# Patient Record
Sex: Female | Born: 1955 | Race: White | Hispanic: No | Marital: Married | Smoking: Former smoker
Health system: Southern US, Community
[De-identification: ages and names within clinical notes are randomized; demographics above are authoritative.]

## PROBLEM LIST (undated history)

## (undated) DIAGNOSIS — S2220XA Unspecified fracture of sternum, initial encounter for closed fracture: Secondary | ICD-10-CM

## (undated) DIAGNOSIS — S2249XA Multiple fractures of ribs, unspecified side, initial encounter for closed fracture: Secondary | ICD-10-CM

## (undated) DIAGNOSIS — T7840XA Allergy, unspecified, initial encounter: Secondary | ICD-10-CM

## (undated) DIAGNOSIS — I10 Essential (primary) hypertension: Secondary | ICD-10-CM

## (undated) DIAGNOSIS — M797 Fibromyalgia: Secondary | ICD-10-CM

## (undated) DIAGNOSIS — F909 Attention-deficit hyperactivity disorder, unspecified type: Secondary | ICD-10-CM

## (undated) DIAGNOSIS — F419 Anxiety disorder, unspecified: Secondary | ICD-10-CM

## (undated) DIAGNOSIS — E785 Hyperlipidemia, unspecified: Secondary | ICD-10-CM

## (undated) HISTORY — PX: ROTATOR CUFF REPAIR: SHX139

## (undated) HISTORY — DX: Multiple fractures of ribs, unspecified side, initial encounter for closed fracture: S22.49XA

## (undated) HISTORY — DX: Hyperlipidemia, unspecified: E78.5

## (undated) HISTORY — PX: BUNIONECTOMY: SHX129

## (undated) HISTORY — DX: Allergy, unspecified, initial encounter: T78.40XA

## (undated) HISTORY — DX: Attention-deficit hyperactivity disorder, unspecified type: F90.9

## (undated) HISTORY — DX: Essential (primary) hypertension: I10

## (undated) HISTORY — PX: OTHER SURGICAL HISTORY: SHX169

## (undated) HISTORY — DX: Anxiety disorder, unspecified: F41.9

## (undated) HISTORY — DX: Fibromyalgia: M79.7

## (undated) HISTORY — DX: Unspecified fracture of sternum, initial encounter for closed fracture: S22.20XA

## (undated) HISTORY — PX: TUBAL LIGATION: SHX77

---
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
## 1995-06-13 DIAGNOSIS — M797 Fibromyalgia: Secondary | ICD-10-CM

## 1995-06-13 HISTORY — DX: Fibromyalgia: M79.7

## 1998-06-28 ENCOUNTER — Other Ambulatory Visit: Admission: RE | Admit: 1998-06-28 | Discharge: 1998-06-28 | Payer: Self-pay | Admitting: Obstetrics & Gynecology

## 1999-06-29 ENCOUNTER — Other Ambulatory Visit: Admission: RE | Admit: 1999-06-29 | Discharge: 1999-06-29 | Payer: Self-pay | Admitting: Obstetrics and Gynecology

## 2000-11-01 ENCOUNTER — Other Ambulatory Visit: Admission: RE | Admit: 2000-11-01 | Discharge: 2000-11-01 | Payer: Self-pay | Admitting: Obstetrics and Gynecology

## 2000-12-21 ENCOUNTER — Ambulatory Visit (HOSPITAL_COMMUNITY): Admission: RE | Admit: 2000-12-21 | Discharge: 2000-12-21 | Payer: Self-pay | Admitting: Obstetrics and Gynecology

## 2000-12-21 ENCOUNTER — Encounter: Payer: Self-pay | Admitting: Obstetrics and Gynecology

## 2003-05-25 ENCOUNTER — Encounter: Admission: RE | Admit: 2003-05-25 | Discharge: 2003-05-25 | Payer: Self-pay | Admitting: Internal Medicine

## 2003-10-26 ENCOUNTER — Other Ambulatory Visit: Admission: RE | Admit: 2003-10-26 | Discharge: 2003-10-26 | Payer: Self-pay | Admitting: Obstetrics and Gynecology

## 2006-01-10 ENCOUNTER — Other Ambulatory Visit: Admission: RE | Admit: 2006-01-10 | Discharge: 2006-01-10 | Payer: Self-pay | Admitting: Obstetrics and Gynecology

## 2006-02-13 ENCOUNTER — Encounter: Admission: RE | Admit: 2006-02-13 | Discharge: 2006-02-13 | Payer: Self-pay | Admitting: Obstetrics and Gynecology

## 2006-05-10 ENCOUNTER — Ambulatory Visit (HOSPITAL_COMMUNITY): Admission: RE | Admit: 2006-05-10 | Discharge: 2006-05-10 | Payer: Self-pay | Admitting: Obstetrics and Gynecology

## 2006-05-10 ENCOUNTER — Encounter (INDEPENDENT_AMBULATORY_CARE_PROVIDER_SITE_OTHER): Payer: Self-pay | Admitting: Specialist

## 2008-06-26 ENCOUNTER — Ambulatory Visit (HOSPITAL_BASED_OUTPATIENT_CLINIC_OR_DEPARTMENT_OTHER): Admission: RE | Admit: 2008-06-26 | Discharge: 2008-06-26 | Payer: Self-pay | Admitting: Orthopedic Surgery

## 2009-05-04 ENCOUNTER — Encounter: Admission: RE | Admit: 2009-05-04 | Discharge: 2009-05-04 | Payer: Self-pay | Admitting: Obstetrics and Gynecology

## 2009-06-12 HISTORY — PX: HAND SURGERY: SHX662

## 2010-07-03 ENCOUNTER — Encounter: Payer: Self-pay | Admitting: Obstetrics and Gynecology

## 2010-09-26 LAB — BASIC METABOLIC PANEL
BUN: 13 mg/dL (ref 6–23)
Calcium: 9.9 mg/dL (ref 8.4–10.5)
Creatinine, Ser: 0.75 mg/dL (ref 0.4–1.2)
GFR calc Af Amer: >60 mL/min (ref >60)
GFR calc non Af Amer: >60 mL/min (ref >60)

## 2010-09-26 LAB — POCT HEMOGLOBIN-HEMACUE: Hemoglobin: 14.6 g/dL (ref 12.0–15.0)

## 2010-10-25 NOTE — Op Note (Signed)
NAMECHARLINE, Katrina Valdez           ACCOUNT NO.:  [id number]   MEDICAL RECORD NO.:  [id number]          PATIENT TYPE:  AMB   LOCATION:  DSC                          FACILITY:  [facility]   PHYSICIAN:  Katy Fitch. Sypher, M.D. DATE OF BIRTH:  [birth year]   DATE OF PROCEDURE:  DATE OF DISCHARGE:                               OPERATIVE REPORT   PREOPERATIVE DIAGNOSIS:  Painful myxoid cyst, right thumb, A1 pulley.   POSTOPERATIVE DIAGNOSIS:  Painful myxoid cyst, right thumb, A1 pulley.   OPERATIONS:  Resection of myxoid cyst and incidental release of A1  pulley.   OPERATING SURGEON:  Katy Fitch. Sypher, MD   ASSISTANT:  Marveen Reeks Dasnoit, PA   ANESTHESIA:  A 2% lidocaine, metacarpal head level block, and flexor  sheath block of right thumb supplemented by IV sedation.   SUPERVISING ANESTHESIOLOGIST:  Kaylyn Layer. Michelle Piper, MD   INDICATIONS:  Katrina Valdez is a 55 year old woman referred for  evaluation and management of a painful mass on the right thumb.  Clinical examination revealed signs of a cyst or myxoid mass on her  right thumb, A1 pulley proximal aspect.  This was troublesome to her.   She had no sign of stenosing tenosynovitis or finger, but had borderline  stenosing tenosynovitis of the thumb.   We recommended excision of local anesthesia and sedation.   After informed consent, she was brought to the operating room at this  time.   PROCEDURE:  Katrina Valdez was brought to the operating room and  placed in supine position on the operating table.   Following an anesthesia consult with Dr. Michelle Piper, general anesthesia by  LMA was rejected and MAC anesthesia recommended.   She was brought to room one at the [facility], placed in  supine position on the operating table and under Dr. Deirdre Priest supervision  sedation provided.  The right arm was then prepped with Betadine soap  solution, sterilely draped.  A pneumatic tourniquet was supplied in  proximal forearm.  Following  infiltration of 2% lidocaine, excellent  anesthesia was achieved in 3-4 minutes.  The right arm was then  exsanguinated with an Esmarch followed by inflation of arterial  tourniquet to 230 mmHg.  Procedure commenced with a short transverse  incision directly over the palpable mass.  Subcutaneous tissue was  carefully divided taking care to place blunt retractors to protect the  neurovascular structures.  The mass was on the proximal aspect of the A1  pulley.  This was circumferentially dissected with a rongeur.  Due to  the borderline stenosed tenosynovitis, the A1 pulley was released with  scalpel and scissors dissection.  Thereafter, free range of motion of  the IP joint of the thumb was recovered.   The wound was then repaired with mattress suture of 5-0 nylon x3.  There  were no apparent complications.  For aftercare, Ms. Stringfield is  provided prescription for Darvocet-N 100 one p.o. 4-6 hours p.r.n. pain,  20 tablets without refill.  We will see her back for followup in our  office for reevaluation in 1 week.      Katy Fitch Sypher, M.D.  Electronically Signed     RVS/MEDQ  D:  06/26/2008  T:  06/26/2008  Job:  [id number]

## 2010-10-28 NOTE — H&P (Signed)
NAMEDARBIE, BIANCARDI           ACCOUNT NO.:  [id number]   MEDICAL RECORD NO.:  [id number]          PATIENT TYPE:  AMB   LOCATION:  SDC                           FACILITY:  [facility]   PHYSICIAN:  Janine Limbo, M.D.DATE OF BIRTH:  1956/01/11   DATE OF ADMISSION:  DATE OF DISCHARGE:                              HISTORY & PHYSICAL   HISTORY OF PRESENT ILLNESS:  Ms. Dirusso is a 55 year old female, para  2-0-0-2, who presents for hysteroscopy with resection and D&C.  She will  also have a NovaSure ablation of the endometrium.  The patient has been  followed at the [facility] Gynecology division of  [facility].  The patient complains of menorrhagia.  An endometrial biopsy was performed that showed benign tissue.  An  ultrasound was performed, and she was found to have an endometrial mass  consistent with a polyp.  The patient had a Mirena IUD for 5 years, and  this was discontinued in August of 2007.  After the IUD was removed, the  patient began having extremely heavy bleeding to the point that she was  soiling her clothes and finding it difficult to work.  The patient's  hemoglobin is 16.8.  The patient is status post dilatation and  curettage.   ALLERGIES:  THE PATIENT REPORTS THAT SHE IS ALLERGIC TO PENICILLIN AND  CODEINE PRODUCTS.   PAST MEDICAL HISTORY:  1. History of hypertension.  2. History of attention deficit hyperactivity disorder.  3. History of fibromyalgia.  4. History of elevated cholesterol.  5. She reports that she has PMS.   PAST SURGICAL HISTORY:  1. She had her tonsils removed in 1973.  2. She had a bunionectomy in 1984.   MEDICATIONS:  She is currently being treated with hydrochlorothiazide,  Adderall, Lipitor, magnesium, black cohosh, 5-HTP, and flax seed oil.   PAST OBSTETRICAL HISTORY:  The patient has had 2 term vaginal  deliveries.   SOCIAL HISTORY:  The patient denies cigarette use.  She was a smoker  in  the past.  She denies recreational drug use.   FAMILY HISTORY:  The patient's father died from sepsis.   PHYSICAL EXAMINATION:  VITAL SIGNS:  Weight is 131 pounds, height 5 feet  3 inches.  HEENT:  Within normal limits.  CHEST:  Clear.  HEART:  Regular rate and rhythm.  BREASTS:  Without masses.  ABDOMEN:  Nontender.  EXTREMITIES:  Grossly normal.  NEUROLOGIC:  Grossly normal.  PELVIC:  External genitalia is normal.  The vagina is normal.  The  cervix is nontender.  The uterus is normal size, shape, and consistency.  Adnexa with no masses.   ASSESSMENT:  1. Menorrhagia.  2. Endometrial polyp.  3. Hypertension.  4. Attention deficit hyperactivity disorder.  5. Hyperlipidemia.   PLAN:  The patient will undergo a hysteroscopy with dilatation and  curettage.  She may have resection of an endometrial polyp.  She will  have NovaSure ablation of the endometrium.  The patient understands the  indications for her surgical procedure, and she accepts the risks of,  but not limited to, anesthetic complications,  bleeding, infection, and  possible damage to the surrounding organs.      Janine Limbo, M.D.  Electronically Signed     AVS/MEDQ  D:  05/08/2006  T:  05/08/2006  Job:  [id number]

## 2010-10-28 NOTE — Op Note (Signed)
NAMEKAYLANNI, EZELLE           ACCOUNT NO.:  [id number]   MEDICAL RECORD NO.:  [id number]          PATIENT TYPE:  AMB   LOCATION:  SDC                           [facility]ACILITY:  [facility]   PHYSICIAN:  Janine Limbo, M.D.DATE OF BIRTH:  09/30/1955   DATE OF PROCEDURE:  05/10/2006  DATE OF DISCHARGE:                               OPERATIVE REPORT   PREOPERATIVE DIAGNOSES:  1. Irregular menstrual cycles.  2. Endometrial polyps.   POSTOPERATIVE DIAGNOSES:  1. Irregular menstrual cycles.  2. Endometrial polyps.   PROCEDURES:  1. Hysteroscopy with dilatation and curettage.  2. NovaSure ablation.   SURGEON:  Dr. Leonard Schwartz.   FIRST ASSISTANT:  None.   ANESTHETIC:  General.   DISPOSITION:  Ms. Blankenburg is a 55 year old woman who had an IUD  removed in 01/2006.  She reports that, since that time, she has had  severe and heavy bleeding.  An endometrial biopsy was performed that  showed benign elements.  Hydrosonography showed an endometrial polyp.  The patient understands the indications for her surgical procedure and  she accepts the risks of, but not limited to, anesthetic complications,  bleeding, infection, and possible damage to the surrounding organs.   FINDINGS:  The uterus was normal size, shape and consistency.  No  adnexal masses were appreciated.  On hysteroscopy, the patient was found  to have endometrial polyps.  No other pathology was noted.   PROCEDURE:  The patient was taken to the operating room where a general  anesthetic was given.  The patient's abdomen, perineum, and vagina were  prepped with multiple layers of Betadine.  A Foley catheter was placed  in the bladder.  Examination under anesthesia was performed.  The  patient was then sterilely draped.  A paracervical block was placed  using 10 mL of 0.5% Marcaine with epinephrine.  An additional 10 mL of  0.5 Marcaine with epinephrine were injected directly into the cervix.  An endocervical  curettage was obtained.  The uterus was sounded to 8 cm.  The cervix was gently dilated.  The diagnostic hysteroscope was  inserted.  The cavity was carefully inspected.  Pictures were taken.  The patient was noted to have endometrial polyps.  The hysteroscope was  removed and the cavity was then curetted using a medium sharp curette.  The cavity was felt to be clean at the end of our procedure.  The cervix  was then dilated further.  The NovaSure instrument was then tested.  The  instrument was then placed in the uterus without difficulty.  The  sounding length was placed was 8 cm.  The cervical length was 3.5 cm.  That left a cavity length of 4.5 cm.  The cavity width was noted to be  2.5 cm.  A test procedure was performed and the integrity of the uterus  was confirmed.  The instrument was placed at a power of 62 and the  cavity was then ablated for total of 1 minute and 50 seconds.  The  patient tolerated her procedure well.  The NovaSure instrument was  removed.  The hysteroscope was inserted and again  the cavity was  inspected.  One additional picture was taken.  All instruments were then  removed.  Repeat examination showed the uterus to be firm and no other  pathology was noted.  Hemostasis was noted to be adequate.  The  estimated fluid deficit was 25 mL.  All instruments were removed.  The  patient was returned to the supine position.  She was awakened from her  anesthetic without difficulty and then transported to the recovery room  in stable condition.  Sponge, needle and instrument counts were correct.  The estimated blood loss for the procedure was 5 mL.   FOLLOW UP INSTRUCTIONS:  The patient will return to see Dr. Stefano Gaul in  2-3 weeks for follow-up examination.  She was given a prescription for  Motrin and she will take 600 mg every 6 hours.  She will take Darvocet-N  100 at 1 or 2 tablets every 4 hours as needed for severe pain.  She was  given a copy of the  postoperative instruction sheet as prepared by the  [facility] for patients who have undergone a  dilatation and curettage.  The endocervical curettage and endometrial  curettings were sent to pathology for evaluation.      Janine Limbo, M.D.  Electronically Signed     AVS/MEDQ  D:  05/10/2006  T:  05/10/2006  Job:  [id number]

## 2011-06-08 ENCOUNTER — Ambulatory Visit (INDEPENDENT_AMBULATORY_CARE_PROVIDER_SITE_OTHER): Payer: BC Managed Care – PPO | Admitting: Physician Assistant

## 2011-06-08 DIAGNOSIS — M79609 Pain in unspecified limb: Secondary | ICD-10-CM

## 2011-06-08 DIAGNOSIS — Z23 Encounter for immunization: Secondary | ICD-10-CM

## 2011-06-08 DIAGNOSIS — F988 Other specified behavioral and emotional disorders with onset usually occurring in childhood and adolescence: Secondary | ICD-10-CM

## 2011-06-15 ENCOUNTER — Ambulatory Visit: Payer: BC Managed Care – PPO | Admitting: Physician Assistant

## 2011-07-20 ENCOUNTER — Telehealth: Payer: Self-pay

## 2011-07-20 NOTE — Telephone Encounter (Signed)
PLEASE PULL CHART AND PUT AT [employer] DESK IN PHONE MESSAGES AND ROUTE BACK TO CLINICAL POOLE. THANKS!

## 2011-07-20 NOTE — Telephone Encounter (Signed)
.  [facility] PT IN NEED OF HER ADDERALL PLEASE CALL [phone]

## 2011-07-21 ENCOUNTER — Other Ambulatory Visit: Payer: Self-pay | Admitting: Internal Medicine

## 2011-07-21 MED ORDER — METHYLPHENIDATE HCL 20 MG PO TABS: ORAL_TABLET | ORAL | Status: DC

## 2011-07-21 NOTE — Telephone Encounter (Signed)
Chart pulled °

## 2011-07-21 NOTE — Telephone Encounter (Signed)
pts adderall last filled 06/08/11, f/up due 11/2011.

## 2011-07-21 NOTE — Telephone Encounter (Signed)
Ritalin RF done, F/U due 11/2010

## 2011-07-21 NOTE — Telephone Encounter (Signed)
Notified pt Rx ready for p/up.  Monico Blitz - please sign this Rx in Epic and close encounter

## 2011-07-24 ENCOUNTER — Other Ambulatory Visit: Payer: Self-pay | Admitting: Physician Assistant

## 2011-07-24 ENCOUNTER — Other Ambulatory Visit: Payer: Self-pay | Admitting: Internal Medicine

## 2011-07-24 MED ORDER — AMPHETAMINE-DEXTROAMPHETAMINE 20 MG PO TABS: 20.0000 mg | ORAL_TABLET | Freq: Three times a day (TID) | ORAL | Status: DC

## 2011-07-24 NOTE — Progress Notes (Signed)
Patient brought back Rx written by Eddie Candle for Ritalin. Patient is on Adderall not Ritalin. Unsure why Ritalin was Rx'd. No mention of change in therapy documented. Have discontinued Ritalin at this time and Rx'd Adderall.  Eula Listen, PA-C 07/24/2011 11:29 AM

## 2011-07-27 ENCOUNTER — Other Ambulatory Visit: Payer: Self-pay | Admitting: Internal Medicine

## 2011-07-27 MED ORDER — ALPRAZOLAM 0.5 MG PO TABS: ORAL_TABLET | ORAL | Status: DC

## 2011-07-31 ENCOUNTER — Other Ambulatory Visit: Payer: Self-pay | Admitting: Family Medicine

## 2011-08-08 ENCOUNTER — Telehealth: Payer: Self-pay

## 2011-08-08 MED ORDER — LISINOPRIL-HYDROCHLOROTHIAZIDE 10-12.5 MG PO TABS: 1.0000 | ORAL_TABLET | Freq: Every day | ORAL | Status: DC

## 2011-08-08 NOTE — Telephone Encounter (Signed)
rx sent into pharmacy and pt notified 

## 2011-08-08 NOTE — Telephone Encounter (Signed)
.  [facility]    PT REQUESTING REFILL ON BP MEDS   BEST PHONE [phone]   PHARMACY [facility] [address]

## 2011-09-01 ENCOUNTER — Telehealth: Payer: Self-pay

## 2011-09-01 NOTE — Telephone Encounter (Signed)
Chart pulled to PA 

## 2011-09-01 NOTE — Telephone Encounter (Signed)
PT IN NEED OF HER ADDERALL TO BE RE-WRITTEN PLEASE CALL [phone] WHEN READY

## 2011-09-02 MED ORDER — AMPHETAMINE-DEXTROAMPHETAMINE 20 MG PO TABS: 20.0000 mg | ORAL_TABLET | Freq: Three times a day (TID) | ORAL | Status: DC

## 2011-09-02 NOTE — Telephone Encounter (Signed)
ADVISED PT THAT RX IS READY FOR PICKUP 

## 2011-09-02 NOTE — Telephone Encounter (Signed)
Signed at TL desk.  

## 2011-09-06 ENCOUNTER — Ambulatory Visit (INDEPENDENT_AMBULATORY_CARE_PROVIDER_SITE_OTHER): Payer: BC Managed Care – PPO | Admitting: Family Medicine

## 2011-09-06 VITALS — BP 114/78 | HR 108 | Temp 98.5°F | Resp 18 | Ht 63.0 in | Wt 131.0 lb

## 2011-09-06 DIAGNOSIS — I10 Essential (primary) hypertension: Secondary | ICD-10-CM

## 2011-09-06 MED ORDER — LISINOPRIL-HYDROCHLOROTHIAZIDE 10-12.5 MG PO TABS: 1.0000 | ORAL_TABLET | Freq: Every day | ORAL | Status: DC

## 2011-09-06 NOTE — Progress Notes (Signed)
  Patient Name: Katrina Valdez Date of Birth: 09-Jul-1955 Medical Record Number: [id number] Gender: female Date of Encounter: 09/06/2011  History of Present Illness:  Katrina Valdez is a 56 y.o. very pleasant female patient who presents with the following:  Here today to recheck her BP.  She feels that she is doing well.  She does not wish to have labs drawn today but does agree to have fasting labs done this summer.    There is no problem list on file for this patient.  No past medical history on file. No past surgical history on file. History  Substance Use Topics  . Smoking status: Former Games developer  . Smokeless tobacco: Not on file  . Alcohol Use: Not on file   No family history on file. Allergies  Allergen Reactions  . Codeine   . Penicillins   . Sulfa Antibiotics     Medication list has been reviewed and updated.  Review of Systems: As per HPI- otherwise negative.   Physical Examination: Filed Vitals:   09/06/11 0900  BP: 114/78  Pulse: 108  Temp: 98.5 F (36.9 C)  TempSrc: Oral  Resp: 18  Height: 5\' 3"  (1.6 m)  Weight: 131 lb (59.421 kg)  recheck pulse- 100BPM  Body mass index is 23.21 kg/(m^2).  GEN: WDWN, NAD, Non-toxic, A & O x 3, slim build HEENT: Atraumatic, Normocephalic. Neck supple. No masses, No LAD. Ears and Nose: No external deformity. CV: RRR, No M/G/R. No JVD. No thrill. No extra heart sounds. PULM: CTA B, no wheezes, crackles, rhonchi. No retractions. No resp. distress. No accessory muscle use. EXTR: No c/c/e NEURO Normal gait.  PSYCH: Normally interactive. Conversant. Not depressed or anxious appearing.  Calm demeanor.    Assessment and Plan: 1. HTN (hypertension)  lisinopril-hydrochlorothiazide (PRINZIDE,ZESTORETIC) 10-12.5 MG per tablet   Refilled medication- BP well controlled today.  She will follow- up for fasting labs in a few months.

## 2011-10-04 ENCOUNTER — Telehealth: Payer: Self-pay

## 2011-10-04 MED ORDER — AMPHETAMINE-DEXTROAMPHETAMINE 20 MG PO TABS: 20.0000 mg | ORAL_TABLET | Freq: Three times a day (TID) | ORAL | Status: DC

## 2011-10-04 MED ORDER — ALPRAZOLAM 0.5 MG PO TABS: ORAL_TABLET | ORAL | Status: DC

## 2011-10-04 NOTE — Telephone Encounter (Signed)
To Chelle for review

## 2011-10-04 NOTE — Telephone Encounter (Signed)
Pt would like for Chelle to write rx for adderall also would like her to see if she can go ahead and do rx for xanax, I Alcario Drought ) explained that she would have to call it into the pharmacy and pt says that she feels that both could be written at same time because it takes so long to get rx filled and plus her husband its about have surgery and doesn't want to have to worry about her not having her xanax right now.

## 2011-10-04 NOTE — Telephone Encounter (Signed)
lmom that rx's are ready for pickup 

## 2011-10-04 NOTE — Telephone Encounter (Signed)
Can we write these rx for this patient?

## 2011-10-04 NOTE — Telephone Encounter (Signed)
Rx's printed. Please call patient.

## 2011-11-08 ENCOUNTER — Telehealth: Payer: Self-pay

## 2011-11-08 MED ORDER — AMPHETAMINE-DEXTROAMPHETAMINE 20 MG PO TABS: 20.0000 mg | ORAL_TABLET | Freq: Three times a day (TID) | ORAL | Status: DC

## 2011-11-08 NOTE — Telephone Encounter (Signed)
PT REQUESTING ADDERALL REFILL  BEST PHONE [phone]

## 2011-11-08 NOTE — Telephone Encounter (Signed)
Rx printed.  Needs F/U and fasting labs in July.

## 2011-11-08 NOTE — Telephone Encounter (Signed)
LMOM that rx was ready for pick up. 

## 2011-11-10 ENCOUNTER — Telehealth: Payer: Self-pay

## 2011-11-10 NOTE — Telephone Encounter (Signed)
Pt was actually calling in regards to Adderall refills. She was told that she needed an office visit before anymore fills.  I advised her that Chelle did not put anything in regards to having an ov and to just call in for a refill as she usually does

## 2011-11-10 NOTE — Telephone Encounter (Signed)
The patient has questions regarding an appointment to discuss her BP medication.  The patient states that she was seen last week for this and her Rx should be good for 6 months to a year.  The patient states she was called and told she has to be seen prior to her BP medication being renewed.  Please call patient at [phone].

## 2011-12-07 ENCOUNTER — Telehealth: Payer: Self-pay

## 2011-12-07 NOTE — Telephone Encounter (Signed)
PATIENT WAS SEEN FOR ALLERGIES.  NOW IT IS GOING TO HER SINUSES, EYES AND BOTHERING HER THROAT.  HER DAUGHTER HAS A NEW BORN AND SHE IS ASSISTING HER DAUGHTER AFTER SHE HAD THE BABY C-SECTION.  DAUGHTER IS ON BED REST. CANT COME IN.  PLEASE CALL.  USING [facility]

## 2011-12-07 NOTE — Telephone Encounter (Signed)
She was last seen in March for her HTN. She needs to come in for an office visit

## 2011-12-08 ENCOUNTER — Ambulatory Visit (INDEPENDENT_AMBULATORY_CARE_PROVIDER_SITE_OTHER): Payer: BC Managed Care – PPO | Admitting: Emergency Medicine

## 2011-12-08 VITALS — BP 114/75 | HR 86 | Temp 98.0°F | Resp 16 | Ht 63.0 in | Wt 125.0 lb

## 2011-12-08 DIAGNOSIS — F988 Other specified behavioral and emotional disorders with onset usually occurring in childhood and adolescence: Secondary | ICD-10-CM

## 2011-12-08 DIAGNOSIS — H1030 Unspecified acute conjunctivitis, unspecified eye: Secondary | ICD-10-CM

## 2011-12-08 DIAGNOSIS — J018 Other acute sinusitis: Secondary | ICD-10-CM

## 2011-12-08 MED ORDER — AMPHETAMINE-DEXTROAMPHETAMINE 20 MG PO TABS: 20.0000 mg | ORAL_TABLET | Freq: Three times a day (TID) | ORAL | Status: DC

## 2011-12-08 MED ORDER — LEVOFLOXACIN 500 MG PO TABS: 500.0000 mg | ORAL_TABLET | Freq: Every day | ORAL | Status: AC

## 2011-12-08 MED ORDER — PSEUDOEPHEDRINE-GUAIFENESIN ER 60-600 MG PO TB12: 1.0000 | ORAL_TABLET | Freq: Two times a day (BID) | ORAL | Status: AC

## 2011-12-08 MED ORDER — CIPROFLOXACIN HCL 0.3 % OP SOLN: 1.0000 [drp] | OPHTHALMIC | Status: AC

## 2011-12-08 NOTE — Telephone Encounter (Signed)
Pt came to clinic for OV 12/09/11.

## 2011-12-08 NOTE — Progress Notes (Signed)
Patient Name: Katrina Valdez Date of Birth: 12/30/1955 Medical Record Number: [id number] Gender: female Date of Encounter: 12/08/2011  History of Present Illness:  Katrina Valdez is a 56 y.o. very pleasant female patient who presents with the following:  History of allergic rhinitis and frequent sinusitis.  Says that she ha been experiencing increasing nasal congestion, sinus pressure, post nasal discharge and green nasal discharge.  Has sore throat and non productive cough.  No fever or chills.  No improvement with otc.  Has bilateral red eyes related to contacts.  Needs refill of adderall  There is no problem list on file for this patient.  No past medical history on file. No past surgical history on file. History  Substance Use Topics  . Smoking status: Former Games developer  . Smokeless tobacco: Not on file  . Alcohol Use: Not on file   No family history on file. Allergies  Allergen Reactions  . Codeine   . Penicillins   . Sulfa Antibiotics     Medication list has been reviewed and updated.  Prior to Admission medications   Medication Sig Start Date End Date Taking? Authorizing Provider  ALPRAZolam Prudy Feeler) 0.5 MG tablet Take one half to one twice daily as needed 10/04/11  Yes Chelle S Nikira Kushnir, PA-C  amphetamine-dextroamphetamine (ADDERALL) 20 MG tablet Take 1 tablet (20 mg total) by mouth 3 (three) times daily. Do not fill before 02/07/2012 12/08/11 01/07/12 Yes Phillips Odor, MD  amphetamine-dextroamphetamine (ADDERALL) 20 MG tablet Take 1 tablet (20 mg total) by mouth 3 (three) times daily. Do not refill prior to 01/07/2012 12/08/11 01/07/12 Yes Phillips Odor, MD  lisinopril-hydrochlorothiazide (PRINZIDE,ZESTORETIC) 10-12.5 MG per tablet Take 1 tablet by mouth daily. 09/06/11 09/05/12 Yes Jessica C Copland, MD  amphetamine-dextroamphetamine (ADDERALL, 20MG ,) 20 MG tablet Take 1 tablet (20 mg total) by mouth 3 (three) times daily. 10/04/11 11/03/11  Chelle S Hazely Sealey, PA-C    ciprofloxacin (CILOXAN) 0.3 % ophthalmic solution Place 1 drop into both eyes every 2 (two) hours. Administer 1 drop, every 2 hours, while awake, for 2 days. Then 1 drop, every 4 hours, while awake, for the next 5 days. 12/08/11 12/18/11  Phillips Odor, MD  levofloxacin (LEVAQUIN) 500 MG tablet Take 1 tablet (500 mg total) by mouth daily. 12/08/11 12/18/11  Phillips Odor, MD  pseudoephedrine-guaifenesin (MUCINEX D) 60-600 MG per tablet Take 1 tablet by mouth every 12 (twelve) hours. 12/08/11 12/07/12  Phillips Odor, MD    Review of Systems:  As per HPI, otherwise negative.    Physical Examination: Filed Vitals:   12/08/11 0921  BP: 114/75  Pulse: 86  Temp: 98 F (36.7 C)  Resp: 16   Filed Vitals:   12/08/11 0921  Height: 5\' 3"  (1.6 m)  Weight: 125 lb (56.7 kg)   Body mass index is 22.14 kg/(m^2). Ideal Body Weight: Weight in (lb) to have BMI = 25: 140.8   GEN: WDWN, NAD, Non-toxic, A & O x 3 HEENT: Atraumatic, Normocephalic. Neck supple. No masses, No LAD.  Bilateral conjunctival injection no chemosis or fb. Ears and Nose: No external deformity. Green nasal and post nasal drainage and tenderness over maxillary sinuses CV: RRR, No M/G/R. No JVD. No thrill. No extra heart sounds. PULM: CTA B, no wheezes, crackles, rhonchi. No retractions. No resp. distress. No accessory muscle use. ABD: S, NT, ND, +BS. No rebound. No HSM. EXTR: No c/c/e NEURO Normal gait.  PSYCH: Normally interactive. Conversant. Not depressed or anxious appearing.  Calm demeanor.  Assessment and Plan: Sinusitis Conjunctivitis ADD Meds and follow up as needed and in 3 months  Carmelina Dane, MD

## 2011-12-28 ENCOUNTER — Other Ambulatory Visit: Payer: Self-pay | Admitting: Family Medicine

## 2011-12-28 ENCOUNTER — Telehealth: Payer: Self-pay

## 2011-12-28 MED ORDER — ALPRAZOLAM 0.5 MG PO TABS: ORAL_TABLET | ORAL | Status: DC

## 2011-12-28 NOTE — Telephone Encounter (Signed)
[facility] notifying patient rx faxed in tonight.

## 2011-12-28 NOTE — Telephone Encounter (Signed)
Message for Chelle-request is being sent for xanax to be filled..[phone]

## 2012-02-28 ENCOUNTER — Other Ambulatory Visit: Payer: Self-pay

## 2012-02-28 MED ORDER — ALPRAZOLAM 0.5 MG PO TABS: ORAL_TABLET | ORAL | Status: DC

## 2012-03-27 ENCOUNTER — Telehealth: Payer: Self-pay

## 2012-03-27 MED ORDER — AMPHETAMINE-DEXTROAMPHETAMINE 20 MG PO TABS: 20.0000 mg | ORAL_TABLET | Freq: Three times a day (TID) | ORAL | Status: DC

## 2012-03-27 NOTE — Telephone Encounter (Signed)
The patient called to request refill of Adderall.  Please call patient at [phone] when ready for pick up.

## 2012-03-27 NOTE — Telephone Encounter (Signed)
Prescription printed and signed.  Per last note (12/08/11) pt to follow-up in 3 months.  Will need OV for more refills.

## 2012-03-27 NOTE — Telephone Encounter (Signed)
I have pended the Rx. Please advise.

## 2012-03-28 NOTE — Telephone Encounter (Signed)
Called her, patient advised this is ready for pick up

## 2012-03-29 ENCOUNTER — Telehealth: Payer: Self-pay

## 2012-03-29 NOTE — Telephone Encounter (Signed)
Received notice that pt's Adderall Prior Katrina Valdez is getting ready to expire. Called [employer] and received approval to continue prior auth. Notified pt that it was approved again.

## 2012-04-28 ENCOUNTER — Ambulatory Visit (INDEPENDENT_AMBULATORY_CARE_PROVIDER_SITE_OTHER): Payer: BC Managed Care – PPO | Admitting: Family Medicine

## 2012-04-28 VITALS — BP 121/80 | HR 84 | Temp 97.6°F | Resp 16 | Ht 63.0 in | Wt 126.0 lb

## 2012-04-28 DIAGNOSIS — F419 Anxiety disorder, unspecified: Secondary | ICD-10-CM | POA: Insufficient documentation

## 2012-04-28 DIAGNOSIS — F411 Generalized anxiety disorder: Secondary | ICD-10-CM

## 2012-04-28 DIAGNOSIS — F909 Attention-deficit hyperactivity disorder, unspecified type: Secondary | ICD-10-CM | POA: Insufficient documentation

## 2012-04-28 DIAGNOSIS — I10 Essential (primary) hypertension: Secondary | ICD-10-CM

## 2012-04-28 DIAGNOSIS — Z23 Encounter for immunization: Secondary | ICD-10-CM

## 2012-04-28 LAB — COMPREHENSIVE METABOLIC PANEL
ALT: 15 U/L (ref 0–35)
AST: 19 U/L (ref 0–37)
Albumin: 4.7 g/dL (ref 3.5–5.2)
BUN: 14 mg/dL (ref 6–23)
CO2: 29 mEq/L (ref 19–32)
Calcium: 9.7 mg/dL (ref 8.4–10.5)
Chloride: 101 mEq/L (ref 96–112)
Creat: 0.54 mg/dL (ref 0.50–1.10)
Potassium: 4.3 mEq/L (ref 3.5–5.3)

## 2012-04-28 LAB — POCT CBC
Granulocyte percent: 45.2 %G (ref 37–80)
Hemoglobin: 14.9 g/dL (ref 12.2–16.2)
MCH, POC: 28.3 pg (ref 27–31.2)
MCV: 92.6 fL (ref 80–97)
RBC: 5.27 M/uL (ref 4.04–5.48)
WBC: 6.2 10*3/uL (ref 4.6–10.2)

## 2012-04-28 LAB — LIPID PANEL
HDL: 81 mg/dL (ref >39)
LDL Cholesterol: 126 mg/dL — ABNORMAL HIGH (ref 0–99)
Triglycerides: 89 mg/dL (ref <150)

## 2012-04-28 LAB — TSH: TSH: 0.788 u[IU]/mL (ref 0.350–4.500)

## 2012-04-28 LAB — POCT URINALYSIS DIPSTICK
Bilirubin, UA: NEGATIVE
Glucose, UA: NEGATIVE
Ketones, UA: NEGATIVE
Nitrite, UA: NEGATIVE
pH, UA: 7

## 2012-04-28 MED ORDER — LISINOPRIL-HYDROCHLOROTHIAZIDE 10-12.5 MG PO TABS: 1.0000 | ORAL_TABLET | Freq: Every day | ORAL | Status: DC

## 2012-04-28 MED ORDER — AMPHETAMINE-DEXTROAMPHETAMINE 20 MG PO TABS: 20.0000 mg | ORAL_TABLET | Freq: Three times a day (TID) | ORAL | Status: DC

## 2012-04-28 MED ORDER — ALPRAZOLAM 0.5 MG PO TABS: ORAL_TABLET | ORAL | Status: DC

## 2012-04-28 NOTE — Patient Instructions (Addendum)
1. Essential hypertension, benign  POCT CBC, Comprehensive metabolic panel, TSH, Lipid panel, POCT urinalysis dipstick, EKG 12-Lead  2. Need for prophylactic vaccination and inoculation against influenza  Flu vaccine greater than or equal to 58yo preservative free IM  3. ADHD (attention deficit hyperactivity disorder)    4. Anxiety    5. HTN (hypertension)  lisinopril-hydrochlorothiazide (PRINZIDE,ZESTORETIC) 10-12.5 MG per tablet     RETURN TO OFFICE IN SIX MONTHS FOR FOLLOW-UP ON ADHD, HYPERTENSION, ANXIETY.

## 2012-04-28 NOTE — Progress Notes (Signed)
[address]   [place], [place]  [address]   [phone]  Subjective:    Patient ID: Katrina Valdez, female    DOB: 1955-08-18, 56 y.o.   MRN: [id number]  HPIThis 56 y.o. female presents for evaluation of the following:  1.  ADD:  Onset age 18; has been on multiple medications in past.  Has worked in [employer].  No side effects; no decreased appetite, headache, insomnia.  Both children ADHD.   Current dose is working well for patient; takes as needed.  Hyperactive.    2.  HTN:  No chest pain, shortness of breath, palpitations, leg swelling.  Not checking BP at home.  Maintained on medication for ten years.  [place] four days per week.  Denies CP/palp/SOB/leg swelling. Denies HA/dizziness/focal weakness/paresthesias.  3. Anxiety:  Takes Xanax as needed; recently husband had B knee replacement; grandchild born.  Taking Xanax as needed.  No daily use.  Usually takes twice per week.  20 months and 5 months.    4.  Immunizations:  Agreeable to flu vaccine; TDAP 2008.    PCP:  Theora Gianotti   Review of Systems  Constitutional: Negative for fever, chills, diaphoresis and fatigue.  Respiratory: Negative for cough, shortness of breath and wheezing.   Cardiovascular: Negative for chest pain, palpitations and leg swelling.  Neurological: Negative for dizziness, tremors, seizures, syncope, facial asymmetry, speech difficulty, weakness, light-headedness, numbness and headaches.  Psychiatric/Behavioral: Positive for decreased concentration. Negative for dysphoric mood and agitation. The patient is nervous/anxious.     Past Medical History  Diagnosis Date  . Hypertension   . Fibromyalgia 1997  . ADHD (attention deficit hyperactivity disorder)   . Allergy   . Anxiety     Past Surgical History  Procedure Date  . Tubal ligation   . Hand surgery 2011  . Tonsills remov 1973  . Sepsis   . Bunionectomy   . Uterine ablation     Prior to Admission medications   Medication  Sig Start Date End Date Taking? Authorizing Provider  ALPRAZolam Prudy Feeler) 0.5 MG tablet Take one half to one twice daily as needed 02/28/12  Yes Ryan M Dunn, PA-C  amphetamine-dextroamphetamine (ADDERALL) 20 MG tablet Take 20 mg by mouth 3 (three) times daily.   Yes Historical Provider, MD  lisinopril-hydrochlorothiazide (PRINZIDE,ZESTORETIC) 10-12.5 MG per tablet Take 1 tablet by mouth daily. 09/06/11 09/05/12 Yes Jessica C Copland, MD  amphetamine-dextroamphetamine (ADDERALL) 20 MG tablet Take 1 tablet (20 mg total) by mouth 3 (three) times daily. Do not fill before 02/07/2012 12/08/11 01/07/12  Phillips Odor, MD  amphetamine-dextroamphetamine (ADDERALL) 20 MG tablet Take 1 tablet (20 mg total) by mouth 3 (three) times daily. Do not refill prior to 01/07/2012 12/08/11 01/07/12  Phillips Odor, MD  amphetamine-dextroamphetamine (ADDERALL) 20 MG tablet Take 1 tablet (20 mg total) by mouth 3 (three) times daily. 03/27/12 04/26/12  Eleanore Delia Chimes, PA-C  pseudoephedrine-guaifenesin (MUCINEX D) 60-600 MG per tablet Take 1 tablet by mouth every 12 (twelve) hours. 12/08/11 12/07/12  Phillips Odor, MD    Allergies  Allergen Reactions  . Codeine   . Penicillins   . Sulfa Antibiotics     History   Social History  . Marital Status: Married    Spouse Name: N/A    Number of Children: N/A  . Years of Education: N/A   Occupational History  . Not on file.   Social History Main Topics  . Smoking status: Former Games developer  . Smokeless tobacco:  Never Used  . Alcohol Use: Not on file  . Drug Use: Not on file  . Sexually Active: Yes   Other Topics Concern  . Not on file   Social History Narrative   Marital: married x 33 years; happily married.   Children:  2 children; 2 grandchildren.   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   Exercise:  [place] 4 days per week    Family History  Problem Relation Age of  Onset  . Stroke Mother 65    hemorrhagic CVA x 2  . Hypertension Mother   . Heart disease Mother 20    AMI  . Hyperlipidemia Son   . Cancer Paternal Aunt        Objective:   Physical Exam  Nursing note and vitals reviewed. Constitutional: She is oriented to person, place, and time. She appears well-developed and well-nourished. No distress.  HENT:  Head: Normocephalic and atraumatic.  Eyes: Conjunctivae normal are normal. Pupils are equal, round, and reactive to light.  Neck: Normal range of motion. Neck supple. No thyromegaly present.  Cardiovascular: Normal rate, regular rhythm, normal heart sounds and intact distal pulses.  Exam reveals no gallop and no friction rub.   No murmur heard. Pulmonary/Chest: Effort normal and breath sounds normal. No respiratory distress. She has no wheezes. She has no rales.  Lymphadenopathy:    She has no cervical adenopathy.  Neurological: She is alert and oriented to person, place, and time. No cranial nerve deficit. She exhibits normal muscle tone. Coordination normal.  Skin: She is not diaphoretic.  Psychiatric: She has a normal mood and affect. Her behavior is normal. Judgment and thought content normal.   INFLUENZA VACCINE ADMINISTERED IN OFFICE.  EKG:  NSR.  Results for orders placed in visit on 04/28/12  POCT CBC      Component Value Range   WBC 6.2  4.6 - 10.2 K/uL   Lymph, poc 2.9  0.6 - 3.4   POC LYMPH PERCENT 47.3  10 - 50 %L   MID (cbc) 0.5  0 - 0.9   POC MID % 7.5  0 - 12 %M   POC Granulocyte 2.8  2 - 6.9   Granulocyte percent 45.2  37 - 80 %G   RBC 5.27  4.04 - 5.48 M/uL   Hemoglobin 14.9  12.2 - 16.2 g/dL   HCT, POC 40.9 (*) 81.1 - 47.9 %   MCV 92.6  80 - 97 fL   MCH, POC 28.3  27 - 31.2 pg   MCHC 30.5 (*) 31.8 - 35.4 g/dL   RDW, POC 91.4     Platelet Count, POC 360  142 - 424 K/uL   MPV 7.7  0 - 99.8 fL  POCT URINALYSIS DIPSTICK      Component Value Range   Color, UA yellow     Clarity, UA clear     Glucose, UA  neg     Bilirubin, UA neg     Ketones, UA neg     Spec Grav, UA 1.015     Blood, UA neg     pH, UA 7.0     Protein, UA neg     Urobilinogen, UA 0.2     Nitrite, UA neg     Leukocytes, UA moderate (2+)        Assessment & Plan:   1. Essential hypertension, benign  POCT CBC, Comprehensive metabolic panel, TSH, Lipid panel, POCT urinalysis dipstick, EKG 12-Lead  2. Need for prophylactic vaccination and inoculation against influenza  Flu vaccine greater than or equal to 3yo preservative free IM  3. ADHD (attention deficit hyperactivity disorder)    4. Anxiety

## 2012-04-29 ENCOUNTER — Encounter: Payer: Self-pay | Admitting: Family Medicine

## 2012-04-29 NOTE — Assessment & Plan Note (Signed)
Stable; intermittent; using Xanax twice weekly; refill provided.  Follow-up every six months; pt expressed understanding.

## 2012-04-29 NOTE — Assessment & Plan Note (Signed)
Administered in office; TDAP UTD in 2008.

## 2012-04-29 NOTE — Assessment & Plan Note (Signed)
Controlled.  Refills x 3 months provided; follow-up every six months; pt expressed understanding.

## 2012-04-29 NOTE — Assessment & Plan Note (Signed)
Controlled; obtain labs; s/p u/a and EKG in office.  Follow-up every six months; pt expressed understanding.

## 2012-06-12 HISTORY — PX: STERNUM: HXRAD1619

## 2012-06-14 ENCOUNTER — Telehealth: Payer: Self-pay | Admitting: *Deleted

## 2012-06-14 NOTE — Telephone Encounter (Signed)
[facility] requesting refill on alprazolam 0.5mg .  Last refill 05/04/12

## 2012-06-17 MED ORDER — ALPRAZOLAM 0.5 MG PO TABS: ORAL_TABLET | ORAL | Status: DC

## 2012-06-17 NOTE — Telephone Encounter (Signed)
OK to call in RF for Alprazolam 0.5mg  one po bid #60 no refills.  Hard copy printed for calling into [facility]. KMS

## 2012-06-17 NOTE — Telephone Encounter (Signed)
rx called in

## 2012-08-20 ENCOUNTER — Telehealth: Payer: Self-pay

## 2012-08-20 NOTE — Telephone Encounter (Signed)
Katrina Valdez,  PT. NEEDS A REFILL OF HER ADDERALL AND XANAX. PLEASE CALL. [phone]

## 2012-08-21 ENCOUNTER — Ambulatory Visit (INDEPENDENT_AMBULATORY_CARE_PROVIDER_SITE_OTHER): Payer: BC Managed Care – PPO | Admitting: Physician Assistant

## 2012-08-21 VITALS — BP 116/76 | HR 95 | Temp 97.7°F | Resp 16 | Ht 63.0 in | Wt 127.8 lb

## 2012-08-21 DIAGNOSIS — F411 Generalized anxiety disorder: Secondary | ICD-10-CM

## 2012-08-21 DIAGNOSIS — F988 Other specified behavioral and emotional disorders with onset usually occurring in childhood and adolescence: Secondary | ICD-10-CM

## 2012-08-21 DIAGNOSIS — J309 Allergic rhinitis, unspecified: Secondary | ICD-10-CM

## 2012-08-21 DIAGNOSIS — I10 Essential (primary) hypertension: Secondary | ICD-10-CM

## 2012-08-21 DIAGNOSIS — F419 Anxiety disorder, unspecified: Secondary | ICD-10-CM

## 2012-08-21 MED ORDER — AMPHETAMINE-DEXTROAMPHETAMINE 20 MG PO TABS: 20.0000 mg | ORAL_TABLET | Freq: Three times a day (TID) | ORAL | Status: DC

## 2012-08-21 MED ORDER — IPRATROPIUM BROMIDE 0.03 % NA SOLN: 2.0000 | Freq: Two times a day (BID) | NASAL | Status: DC

## 2012-08-21 MED ORDER — ALPRAZOLAM 0.5 MG PO TABS: ORAL_TABLET | ORAL | Status: DC

## 2012-08-21 MED ORDER — LISINOPRIL-HYDROCHLOROTHIAZIDE 10-12.5 MG PO TABS: 1.0000 | ORAL_TABLET | Freq: Every day | ORAL | Status: DC

## 2012-08-21 MED ORDER — CETIRIZINE HCL 10 MG PO TABS: 10.0000 mg | ORAL_TABLET | Freq: Every day | ORAL | Status: DC

## 2012-08-21 MED ORDER — FLUTICASONE PROPIONATE 50 MCG/ACT NA SUSP: 2.0000 | Freq: Every day | NASAL | Status: DC

## 2012-08-21 NOTE — Progress Notes (Signed)
Subjective:    Patient ID: Katrina Valdez, female    DOB: [birth year], 57 y.o.   MRN: [id number]  HPI   Katrina Valdez is a pleasant 57 yr old female here with concern about allergies.  States she has had allergy symptoms since about Thanksgiving, worse in the last 2 months.  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  Feels like she is getting exposed to "daycare germs".  Experiencing nasal/sinus congestion, ear fullness, headache, itchy/watery eyes, some cough.  She does not take anything for allergies currently.  Has tried Claritin without relief.  Concerned about which medications she can take with her hypertension.  Feels like she got good relief with a medication prescribed to her last June (on chart review, Mucinex-D).  States she has used a nasal spray in the past, not sure which one but thinks maybe Flonase.  Has been doing work out in the yard, clearing trees.  Feels like this may be contributing to symptoms.  Concerned that she will progress to sinus infection or ear infection as this has happened in the past.     Review of Systems  Constitutional: Negative for fever and chills.  HENT: Positive for ear pain, congestion, rhinorrhea and postnasal drip.   Eyes: Positive for discharge (watery) and itching.  Respiratory: Positive for cough. Negative for shortness of breath and wheezing.   Cardiovascular: Negative.   Gastrointestinal: Negative.   Musculoskeletal: Negative.   Neurological: Positive for headaches.       Objective:   Physical Exam  Vitals reviewed. Constitutional: She is oriented to person, place, and time. She appears well-developed and well-nourished. No distress.  HENT:  Head: Normocephalic and atraumatic.  Right Ear: Tympanic membrane and ear canal normal.  Left Ear: Tympanic membrane and ear canal normal.  Nose: Nose normal. Right sinus exhibits no maxillary sinus tenderness and no frontal sinus tenderness. Left sinus exhibits no  maxillary sinus tenderness and no frontal sinus tenderness.  Mouth/Throat: Uvula is midline, oropharynx is clear and moist and mucous membranes are normal.  Eyes: Conjunctivae are normal. No scleral icterus.  Neck: Neck supple.  Cardiovascular: Normal rate, regular rhythm and normal heart sounds.  Exam reveals no gallop and no friction rub.   No murmur heard. Pulmonary/Chest: Effort normal and breath sounds normal. She has no wheezes. She has no rales.  Lymphadenopathy:    She has no cervical adenopathy.  Neurological: She is alert and oriented to person, place, and time.  Skin: Skin is warm and dry.  Psychiatric: She has a normal mood and affect. Her behavior is normal.      Filed Vitals:   08/21/12 0855  BP: 116/76  Pulse: 95  Temp: 97.7 F (36.5 C)  Resp: 16       Assessment & Plan:  Allergic rhinitis - Plan: cetirizine (ZYRTEC) 10 MG tablet, ipratropium (ATROVENT) 0.03 % nasal spray, fluticasone (FLONASE) 50 MCG/ACT nasal spray  -- Symptoms are consistent with allergic rhinitis.  At this time, she does not have OM or sinusitis.  Discussed with pt that allergies will need to be controlled with medication long term.  Will start Zyrtec and Flonase daily.  Additionally, I sent in Atrovent nasal spray for symptomatic relief of congestion and ear fullness.  If symptoms worsen or are not improving, pt will let us know.  Anxiety - Plan: ALPRAZolam (XANAX) 0.5 MG tablet  -- Pt requests refill.  Telephone call was placed yesterday and routed to Dr. Katrinka Blazing.  At time of visit, medication and not been approved or denied.  Discussed with pt that I could provide a one-time 30 day supply, or she could wait for a response from Dr. Katrinka Blazing.  Pt would like a prescription now.  Will call for further refills.  ADD (attention deficit disorder) - Plan: amphetamine-dextroamphetamine (ADDERALL) 20 MG tablet  -- As above, pt request refill.  Request already sent to Dr. Katrinka Blazing, but at time of visit, no  response yet.  Pt would like prescription now.  I have given her a 30 day supply.  She will need to call for further refills.    HTN (hypertension) - Plan: lisinopril-hydrochlorothiazide (PRINZIDE,ZESTORETIC) 10-12.5 MG per tablet  -- Medication refilled per pt request.  Due for follow-up May 2014.

## 2012-08-21 NOTE — Patient Instructions (Addendum)
Begin taking Zyrtec (cetirizine) once daily - this lasts 24 hours, so you can take it morning or night.  This needs to be continued daily through allergy season.    Additionally begin using the Flonase (fluticasone) nasal spray daily, this will help control allergy symptoms as well.  This needs to be continued long term to achieve the greatest benefit, so continue this through allergy season as well.    I have also sent Atrovent nasal spray to the pharmacy.  This can be used twice daily to help relieve congestion and ear fullness.  It works as a decongestant, but does not have the adverse affects associated with Afrin and similar medications.   If you feel like your symptoms are worsening or not improving, please let us know.  I have refilled your Adderall and Xanax.  You may call in when you need refills and either Chelle or Dr. Katrinka Blazing can provide them for you  [facility] Policy for Prescribing Controlled Substances (Revised 04/2012) 1. Prescriptions for controlled substances will be filled by ONE provider at [facility] with whom you have established and developed a plan for your care, including follow-up. 2. You are encouraged to schedule an appointment with your prescriber at our appointment center for follow-up visits whenever possible. 3. If you request a prescription for the controlled substance while at [facility] for an acute problem (with someone other than your regular prescriber), you MAY be given a ONE-TIME prescription for a 30-day supply of the controlled substance, to allow time for you to return to see your regular prescriber for additional prescriptions.   Allergic Rhinitis Allergic rhinitis is when the mucous membranes in the nose respond to allergens. Allergens are particles in the air that cause your body to have an allergic reaction. This causes you to release allergic antibodies. Through a chain of events, these eventually cause you to release histamine into the blood stream (hence the use of  antihistamines). Although meant to be protective to the body, it is this release that causes your discomfort, such as frequent sneezing, congestion and an itchy runny nose.  CAUSES  The pollen allergens may come from grasses, trees, and weeds. This is seasonal allergic rhinitis, or "hay fever." Other allergens cause year-round allergic rhinitis (perennial allergic rhinitis) such as house dust mite allergen, pet dander and mold spores.  SYMPTOMS   Nasal stuffiness (congestion).  Runny, itchy nose with sneezing and tearing of the eyes.  There is often an itching of the mouth, eyes and ears. It cannot be cured, but it can be controlled with medications. DIAGNOSIS  If you are unable to determine the offending allergen, skin or blood testing may find it. TREATMENT   Avoid the allergen.  Medications and allergy shots (immunotherapy) can help.  Hay fever may often be treated with antihistamines in pill or nasal spray forms. Antihistamines block the effects of histamine. There are over-the-counter medicines that may help with nasal congestion and swelling around the eyes. Check with your caregiver before taking or giving this medicine. If the treatment above does not work, there are many new medications your caregiver can prescribe. Stronger medications may be used if initial measures are ineffective. Desensitizing injections can be used if medications and avoidance fails. Desensitization is when a patient is given ongoing shots until the body becomes less sensitive to the allergen. Make sure you follow up with your caregiver if problems continue. SEEK MEDICAL CARE IF:   You develop fever (more than 100.5 F (38.1 C).  You  develop a cough that does not stop easily (persistent).  You have shortness of breath.  You start wheezing.  Symptoms interfere with normal daily activities. Document Released: 02/21/2001 Document Revised: 08/21/2011 Document Reviewed: 09/02/2008 [facility] Patient  Information 2013 [place], [place].

## 2012-08-26 NOTE — Telephone Encounter (Signed)
Refill for Adderall and Xanax provided to patient at visit on 08/21/12 by Frances Furbish, PA-C.  Advised to call for refill in one month.  Will warrant follow-up in 10/2012.

## 2012-10-03 ENCOUNTER — Telehealth: Payer: Self-pay

## 2012-10-03 MED ORDER — AMPHETAMINE-DEXTROAMPHETAMINE 20 MG PO TABS: 20.0000 mg | ORAL_TABLET | Freq: Three times a day (TID) | ORAL | Status: DC

## 2012-10-03 NOTE — Telephone Encounter (Signed)
Please advise pt that Adderall is ready for pick up. She will be due for 6 month follow-up for ADD in May 2014.

## 2012-10-03 NOTE — Telephone Encounter (Signed)
Patient aware. She understands she will need to come in next month for a follow up.

## 2012-10-03 NOTE — Telephone Encounter (Signed)
Adderall rx ready for pick up  

## 2012-10-03 NOTE — Telephone Encounter (Signed)
Patient requesting refill of Adderall- Last filled 08/21/12.

## 2012-10-03 NOTE — Telephone Encounter (Signed)
Pt is calling because she needs a refill on her Adderall Call back number is [phone]

## 2012-10-25 ENCOUNTER — Other Ambulatory Visit: Payer: Self-pay | Admitting: Physician Assistant

## 2012-10-29 ENCOUNTER — Other Ambulatory Visit: Payer: Self-pay | Admitting: Physician Assistant

## 2012-10-29 NOTE — Telephone Encounter (Signed)
Due for six month follow-up; must have six month follow-up visit in May 2014 for Xanax, Adderall refills.

## 2012-10-30 ENCOUNTER — Telehealth: Payer: Self-pay

## 2012-10-30 ENCOUNTER — Other Ambulatory Visit: Payer: Self-pay | Admitting: Physician Assistant

## 2012-10-30 NOTE — Telephone Encounter (Signed)
I approved this request on 5/16 for 30 tablets; did pharmacy receive this refill?  If yes, did patient fill rx?  Pt is due for six month follow-up for anxiety and ADHD; she is due for six month follow-up in May 2014.

## 2012-10-30 NOTE — Telephone Encounter (Signed)
Call pt --- must have office visit in upcoming month for further refills of Xanax and Adderall; this is a duplicate refill request; I approved on 10/25/12; if patient filled medication on 10/25/12, then refill needs to be denied.

## 2012-10-30 NOTE — Telephone Encounter (Signed)
Pharm requests RF of alprazolam 0.5 mg. See notes from last OV w/Elizabeth.

## 2012-10-31 ENCOUNTER — Telehealth: Payer: Self-pay

## 2012-10-31 NOTE — Telephone Encounter (Signed)
I phoned it in. Unclear why they did not get this. Called again.

## 2012-10-31 NOTE — Telephone Encounter (Signed)
done

## 2012-10-31 NOTE — Telephone Encounter (Signed)
Was sent in, fax from pharmacy was old

## 2012-10-31 NOTE — Telephone Encounter (Signed)
THIS CALL WAS RECEIVED FROM STEPHANIE AT [facility].  SHE STATES SHE SENT OVER A REFILL REQUEST FOR THE PATIENT TO HAVE A REFILL ON ALPRAZOLAM 0.5MG . QUALITY OF #60. WE SENT IT BACK SAYING THAT IT WAS DENIED BECAUSE WE GAVE REFILLS ON MAY 20TH. SHE SAID THAT THEY DID NOT RECEIVE  ANYTHING FROM [facility] ON MAY 20TH.  [facility] ([phone]) [phone]  -- STEPHANIE      MBC

## 2012-11-17 ENCOUNTER — Ambulatory Visit (INDEPENDENT_AMBULATORY_CARE_PROVIDER_SITE_OTHER): Payer: BC Managed Care – PPO | Admitting: Emergency Medicine

## 2012-11-17 VITALS — BP 114/78 | HR 102 | Temp 98.0°F | Resp 16 | Ht 63.0 in | Wt 130.8 lb

## 2012-11-17 DIAGNOSIS — J309 Allergic rhinitis, unspecified: Secondary | ICD-10-CM

## 2012-11-17 DIAGNOSIS — I10 Essential (primary) hypertension: Secondary | ICD-10-CM

## 2012-11-17 DIAGNOSIS — Z733 Stress, not elsewhere classified: Secondary | ICD-10-CM

## 2012-11-17 DIAGNOSIS — F988 Other specified behavioral and emotional disorders with onset usually occurring in childhood and adolescence: Secondary | ICD-10-CM

## 2012-11-17 DIAGNOSIS — F439 Reaction to severe stress, unspecified: Secondary | ICD-10-CM

## 2012-11-17 DIAGNOSIS — Z760 Encounter for issue of repeat prescription: Secondary | ICD-10-CM

## 2012-11-17 LAB — POCT CBC
Lymph, poc: 2.1 (ref 0.6–3.4)
MCHC: 31.5 g/dL — AB (ref 31.8–35.4)
MPV: 7.7 fL (ref 0–99.8)
POC Granulocyte: 4.1 (ref 2–6.9)
POC LYMPH PERCENT: 31.3 %L (ref 10–50)
POC MID %: 8 %M (ref 0–12)
RDW, POC: 14 %

## 2012-11-17 LAB — COMPREHENSIVE METABOLIC PANEL
Alkaline Phosphatase: 84 U/L (ref 39–117)
BUN: 16 mg/dL (ref 6–23)
Glucose, Bld: 94 mg/dL (ref 70–99)
Sodium: 137 mEq/L (ref 135–145)
Total Bilirubin: 0.8 mg/dL (ref 0.3–1.2)
Total Protein: 6.8 g/dL (ref 6.0–8.3)

## 2012-11-17 LAB — LIPID PANEL
HDL: 66 mg/dL (ref >39)
Total CHOL/HDL Ratio: 3.4 Ratio
Triglycerides: 166 mg/dL — ABNORMAL HIGH (ref <150)

## 2012-11-17 MED ORDER — AMPHETAMINE-DEXTROAMPHETAMINE 20 MG PO TABS: 20.0000 mg | ORAL_TABLET | Freq: Two times a day (BID) | ORAL | Status: DC

## 2012-11-17 MED ORDER — ALPRAZOLAM 0.5 MG PO TABS: ORAL_TABLET | ORAL | Status: DC

## 2012-11-17 MED ORDER — LISINOPRIL-HYDROCHLOROTHIAZIDE 10-12.5 MG PO TABS: 1.0000 | ORAL_TABLET | Freq: Every day | ORAL | Status: DC

## 2012-11-17 MED ORDER — IPRATROPIUM BROMIDE 0.03 % NA SOLN: 2.0000 | Freq: Two times a day (BID) | NASAL | Status: DC

## 2012-11-17 MED ORDER — FLUTICASONE PROPIONATE 50 MCG/ACT NA SUSP: 2.0000 | Freq: Every day | NASAL | Status: DC

## 2012-11-17 NOTE — Progress Notes (Signed)
  Subjective:    Patient ID: Katrina Valdez, female    DOB: 1955/12/24, 57 y.o.   MRN: [id number]  HPI Patient has a family history of heart disease, Father had a heart attack at 49  Pt does not smoke exercises daily. She states she feels great. She denies any chest pain shortness of breath or any other cardiovascular symptoms. She is under a lot of stress at home taking care of 2 small children. She continues on her blood pressure medication. She's not a smoker.   Review of Systems     Objective:   Physical Exam patient is alert cooperative she is not in any distress. Her neck is supple. Her chest is clear. Her heart exam reveals a regular rate no murmurs. The abdomen is soft nontender. Extremities are without edema.        Assessment & Plan:  All meds were refilled for blood pressure and allergies. I did decrease her Adderall to 2 tablets a day to meet the current recommendations. Referral has been made [facility] for their evaluation to be sure she has cardiac clearance to continue the medication .

## 2012-11-18 ENCOUNTER — Encounter: Payer: Self-pay | Admitting: Radiology

## 2012-11-22 ENCOUNTER — Encounter: Payer: Self-pay | Admitting: Cardiovascular Disease

## 2012-11-22 ENCOUNTER — Ambulatory Visit (INDEPENDENT_AMBULATORY_CARE_PROVIDER_SITE_OTHER): Payer: BC Managed Care – PPO | Admitting: Cardiovascular Disease

## 2012-11-22 VITALS — BP 132/92 | HR 91 | Resp 16 | Ht 63.0 in | Wt 132.8 lb

## 2012-11-22 DIAGNOSIS — F909 Attention-deficit hyperactivity disorder, unspecified type: Secondary | ICD-10-CM

## 2012-11-22 DIAGNOSIS — I10 Essential (primary) hypertension: Secondary | ICD-10-CM

## 2012-11-22 DIAGNOSIS — E782 Mixed hyperlipidemia: Secondary | ICD-10-CM

## 2012-11-22 NOTE — Progress Notes (Signed)
Patient ID: Katrina Valdez, female   DOB: Dec 08, 1955, 57 y.o.   MRN: [id number]     Reason for office visit Hypertension, hyperlipidemia  Katrina Valdez's husband is also our patient. She was referred by her primary care physician for evaluation of her coronary risk factors. She has adult ADD and has taken an amphetamine therapy for years. As she is getting older there is concern regarding continuing this treatment due to its potential cardiovascular side effects.  She has had mildly elevated lipids or many years. She tried taking 2 different statins on 2 different occasions and had severe muscle aches. She makes it very plainly clear that she will never try to take statins again.  Her blood pressure is well-controlled.  She is active. She is to participate in his in the classes 3 days a week but recently has stopped since her "mentor" has quit. She does try to eat healthy and maintain a healthy weight. She cares for 2 toddler grandchildren which "keep her on the go all the time".    Allergies  Allergen Reactions  . Codeine   . Penicillins   . Sulfa Antibiotics     Current Outpatient Prescriptions  Medication Sig Dispense Refill  . ALPRAZolam (XANAX) 0.5 MG tablet TAKE 1/2 TO 1 TABLET BY MOUTH 2 TIMES A DAY AS NEEDED.  30 tablet  2  . amphetamine-dextroamphetamine (ADDERALL) 20 MG tablet Take 1 tablet (20 mg total) by mouth 2 (two) times daily.  60 tablet  0  . cetirizine (ZYRTEC) 10 MG tablet Take 1 tablet (10 mg total) by mouth daily.  30 tablet  11  . Flaxseed, Linseed, (FLAX SEED OIL) 1000 MG CAPS Take by mouth.      . fluticasone (FLONASE) 50 MCG/ACT nasal spray Place 2 sprays into the nose daily.  16 g  11  . ipratropium (ATROVENT) 0.03 % nasal spray Place 2 sprays into the nose 2 (two) times daily as needed.      Marland Kitchen lisinopril-hydrochlorothiazide (PRINZIDE,ZESTORETIC) 10-12.5 MG per tablet Take 1 tablet by mouth daily.  90 tablet  3  . magnesium 30 MG tablet Take 250 mg by  mouth daily.       . pseudoephedrine-guaifenesin (MUCINEX D) 60-600 MG per tablet Take 1 tablet by mouth every 12 (twelve) hours.  18 tablet  0   No current facility-administered medications for this visit.    Past Medical History  Diagnosis Date  . Hypertension   . Fibromyalgia 1997  . ADHD (attention deficit hyperactivity disorder)   . Allergy   . Anxiety   . Hyperlipidemia     Past Surgical History  Procedure Laterality Date  . Tubal ligation    . Hand surgery  2011  . Tonsills remov  1973  . Sepsis    . Bunionectomy    . Uterine ablation      Family History  Problem Relation Age of Onset  . Stroke Mother 23    hemorrhagic CVA x 2  . Hypertension Mother   . Heart disease Mother 89    AMI  . Hyperlipidemia Son   . Cancer Paternal Aunt   . Heart disease Father   . Heart disease Daughter     monitoring cardiac    History   Social History  . Marital Status: Married    Spouse Name: N/A    Number of Children: N/A  . Years of Education: N/A   Occupational History  . retired    Social History  Main Topics  . Smoking status: Former Smoker    Quit date: 11/22/1992  . Smokeless tobacco: Never Used  . Alcohol Use: Yes     Comment: daily 2 drinks  . Drug Use: No  . Sexually Active: Yes    Birth Control/ Protection: None     Comment: number of sex partners in the last 12 months 1   Other Topics Concern  . Not on file   Social History Narrative   Marital: married x 33 years; happily married.      Children:  2 children; 2 grandchildren.      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      Exercise:  [place] 4 days per week    Review of systems: The patient specifically denies any chest pain at rest or with exertion, dyspnea at rest or with exertion, orthopnea, paroxysmal nocturnal dyspnea, syncope, palpitations, focal neurological deficits, intermittent  claudication, lower extremity edema, unexplained weight gain, cough, hemoptysis or wheezing.  The patient also denies abdominal pain, nausea, vomiting, dysphagia, diarrhea, constipation, polyuria, polydipsia, dysuria, hematuria, frequency, urgency, abnormal bleeding or bruising, fever, chills, unexpected weight changes, mood swings, change in skin or hair texture, change in voice quality, auditory or visual problems, allergic reactions or rashes, new musculoskeletal complaints other than usual "aches and pains".   PHYSICAL EXAM BP 132/92  Pulse 91  Resp 16  Ht 5\' 3"  (1.6 m)  Wt 132 lb 12.8 oz (60.238 kg)  BMI 23.53 kg/m2  General: Alert, oriented x3, no distress Head: no evidence of trauma, PERRL, EOMI, no exophtalmos or lid lag, no myxedema, no xanthelasma; normal ears, nose and oropharynx Neck: normal jugular venous pulsations and no hepatojugular reflux; brisk carotid pulses without delay and no carotid bruits Chest: clear to auscultation, no signs of consolidation by percussion or palpation, normal fremitus, symmetrical and full respiratory excursions Cardiovascular: normal position and quality of the apical impulse, regular rhythm, normal first and second heart sounds, no murmurs, rubs or gallops Abdomen: no tenderness or distention, no masses by palpation, no abnormal pulsatility or arterial bruits, normal bowel sounds, no hepatosplenomegaly Extremities: no clubbing, cyanosis or edema; 2+ radial, ulnar and brachial pulses bilaterally; 2+ right femoral, posterior tibial and dorsalis pedis pulses; 2+ left femoral, posterior tibial and dorsalis pedis pulses; no subclavian or femoral bruits Neurological: grossly nonfocal   EKG: Normal sinus rhythm  Lipid Panel     Component Value Date/Time   CHOL 226* 11/17/2012 1012   TRIG 166* 11/17/2012 1012   HDL 66 11/17/2012 1012   CHOLHDL 3.4 11/17/2012 1012   VLDL 33 11/17/2012 1012   LDLCALC 127* 11/17/2012 1012    BMET    Component Value  Date/Time   NA 137 11/17/2012 1012   K 4.4 11/17/2012 1012   CL 100 11/17/2012 1012   CO2 25 11/17/2012 1012   GLUCOSE 94 11/17/2012 1012   BUN 16 11/17/2012 1012   CREATININE 0.77 11/17/2012 1012   CREATININE 0.75 06/24/2008 1330   CALCIUM 9.5 11/17/2012 1012   GFRNONAA >60 06/24/2008 1330   GFRAA  Value: >60        The eGFR has been calculated using the MDRD equation. This calculation has not been validated in all clinical situations. eGFR's persistently <60 mL/min signify possible Chronic Kidney Disease. 06/24/2008 1330     ASSESSMENT AND PLAN Essential hypertension, benign Well-controlled  Mixed  hyperlipidemia All of her lipid parameters are slightly higher than desirable including the total cholesterol, triglycerides and LDL cholesterol. Strictly reviewing these parameters according to guidelines her target LDL cholesterol is less than 1:30 and she does not really meet criteria for pharmacological therapy, but should implement lifestyle changes more intensely. She's already a healthy weight. We talked about avoiding sugars, starches with high glycemic index and saturated fats and increasing her intake of protein and unsaturated fat. She should go back to her regular physical exercise regimen. She makes it quite clear that she would never want to take statin medication.  ADHD (attention deficit hyperactivity disorder) Her heart rate at rest is a little faster than average and this is attributable to amphetamine therapy. Since she is approaching the age of 63 and wishes to continue taking this medication I think is quite reasonable that she undergo a treadmill ECG stress test.  If her treadmill stress test is normal, I do not think she will need further cardiology evaluation at this time.  Orders Placed This Encounter  Procedures  . EKG 12-Lead  . Exercise tolerance test   Meds ordered this encounter  Medications  . ipratropium (ATROVENT) 0.03 % nasal spray    Sig: Place 2 sprays into the nose 2  (two) times daily as needed.    Junious Silk, MD, [facility] [phone] office [phone] pager

## 2012-11-22 NOTE — Assessment & Plan Note (Signed)
Her heart rate at rest is a little faster than average and this is attributable to amphetamine therapy. Since she is approaching the age of 7 and wishes to continue taking this medication I think is quite reasonable that she undergo a treadmill ECG stress test.

## 2012-11-22 NOTE — Patient Instructions (Addendum)
Exercise Treadmill Test.  Will call with results.  Return as needed.

## 2012-11-22 NOTE — Assessment & Plan Note (Signed)
All of her lipid parameters are slightly higher than desirable including the total cholesterol, triglycerides and LDL cholesterol. Strictly reviewing these parameters according to guidelines her target LDL cholesterol is less than 1:30 and she does not really meet criteria for pharmacological therapy, but should implement lifestyle changes more intensely. She's already a healthy weight. We talked about avoiding sugars, starches with high glycemic index and saturated fats and increasing her intake of protein and unsaturated fat. She should go back to her regular physical exercise regimen. She makes it quite clear that she would never want to take statin medication.

## 2012-11-22 NOTE — Assessment & Plan Note (Signed)
Well controlled 

## 2012-11-27 ENCOUNTER — Ambulatory Visit (HOSPITAL_COMMUNITY)
Admission: RE | Admit: 2012-11-27 | Discharge: 2012-11-27 | Disposition: A | Discharge status: Home / Self Care | Payer: BC Managed Care – PPO | Source: Ambulatory Visit | Attending: Cardiovascular Disease | Admitting: Cardiovascular Disease

## 2012-11-27 DIAGNOSIS — F419 Anxiety disorder, unspecified: Secondary | ICD-10-CM

## 2012-11-27 DIAGNOSIS — E782 Mixed hyperlipidemia: Secondary | ICD-10-CM

## 2012-11-27 DIAGNOSIS — I10 Essential (primary) hypertension: Secondary | ICD-10-CM | POA: Insufficient documentation

## 2012-11-27 DIAGNOSIS — F411 Generalized anxiety disorder: Secondary | ICD-10-CM | POA: Insufficient documentation

## 2012-11-27 DIAGNOSIS — F909 Attention-deficit hyperactivity disorder, unspecified type: Secondary | ICD-10-CM | POA: Insufficient documentation

## 2012-11-29 ENCOUNTER — Encounter: Payer: Self-pay | Admitting: Cardiovascular Disease

## 2012-12-23 ENCOUNTER — Other Ambulatory Visit: Payer: Self-pay | Admitting: Family Medicine

## 2012-12-25 NOTE — Telephone Encounter (Signed)
Called in rx

## 2013-02-14 ENCOUNTER — Telehealth: Payer: Self-pay

## 2013-02-14 DIAGNOSIS — F988 Other specified behavioral and emotional disorders with onset usually occurring in childhood and adolescence: Secondary | ICD-10-CM

## 2013-02-14 NOTE — Telephone Encounter (Signed)
Pt states that she needs a refill on adderall 20mg  3x's a day and also a refill on xanax. Best# [phone]

## 2013-02-14 NOTE — Telephone Encounter (Signed)
Please advise. On 11/17/12, patients prescription was changed to bid dosing patient requesting tid. Pended both.

## 2013-02-14 NOTE — Telephone Encounter (Signed)
She cannot take the Adderall 3 times a day. She already has problems with anxiety and this will make the anxiety worse. She can have a refill one time on both medications and she needs to make an appointment at 104 to discuss her meds

## 2013-02-15 NOTE — Telephone Encounter (Signed)
LMOM to CB. 

## 2013-02-15 NOTE — Telephone Encounter (Signed)
Spoke with pt, advised message from Dr Cleta Alberts. She doesn't understand why she cannot get it TID, it was written this way before and she thought this was a mistake. She states its ok and just write for what you think she should have. Chelle usually wrote for this.

## 2013-02-16 NOTE — Telephone Encounter (Signed)
I want her to take a medicine twice a day. She can come in and discuss this with Chelle if she would like.

## 2013-02-17 MED ORDER — AMPHETAMINE-DEXTROAMPHETAMINE 20 MG PO TABS: 20.0000 mg | ORAL_TABLET | Freq: Two times a day (BID) | ORAL | Status: DC

## 2013-02-17 NOTE — Telephone Encounter (Signed)
Printed placed at front desk for pick up patient aware. Will you advise on the Alprazolam refill, pended.

## 2013-02-17 NOTE — Telephone Encounter (Signed)
Called her. Left message for her to call me back.  

## 2013-02-25 NOTE — Telephone Encounter (Signed)
She has refills remaining

## 2013-03-08 ENCOUNTER — Emergency Department (HOSPITAL_COMMUNITY)
Admission: EM | Admit: 2013-03-08 | Discharge: 2013-03-08 | Disposition: A | Discharge status: Home / Self Care | Payer: BC Managed Care – PPO | Attending: Emergency Medicine | Admitting: Emergency Medicine

## 2013-03-08 ENCOUNTER — Emergency Department (HOSPITAL_COMMUNITY): Payer: BC Managed Care – PPO

## 2013-03-08 ENCOUNTER — Encounter (HOSPITAL_COMMUNITY): Payer: Self-pay | Admitting: *Deleted

## 2013-03-08 DIAGNOSIS — S2220XA Unspecified fracture of sternum, initial encounter for closed fracture: Secondary | ICD-10-CM | POA: Insufficient documentation

## 2013-03-08 DIAGNOSIS — F909 Attention-deficit hyperactivity disorder, unspecified type: Secondary | ICD-10-CM | POA: Insufficient documentation

## 2013-03-08 DIAGNOSIS — Z862 Personal history of diseases of the blood and blood-forming organs and certain disorders involving the immune mechanism: Secondary | ICD-10-CM | POA: Insufficient documentation

## 2013-03-08 DIAGNOSIS — S3981XA Other specified injuries of abdomen, initial encounter: Secondary | ICD-10-CM | POA: Insufficient documentation

## 2013-03-08 DIAGNOSIS — F411 Generalized anxiety disorder: Secondary | ICD-10-CM | POA: Insufficient documentation

## 2013-03-08 DIAGNOSIS — Z79899 Other long term (current) drug therapy: Secondary | ICD-10-CM | POA: Insufficient documentation

## 2013-03-08 DIAGNOSIS — S0990XA Unspecified injury of head, initial encounter: Secondary | ICD-10-CM | POA: Insufficient documentation

## 2013-03-08 DIAGNOSIS — Z880 Allergy status to penicillin: Secondary | ICD-10-CM | POA: Insufficient documentation

## 2013-03-08 DIAGNOSIS — Z87891 Personal history of nicotine dependence: Secondary | ICD-10-CM | POA: Insufficient documentation

## 2013-03-08 DIAGNOSIS — Y9389 Activity, other specified: Secondary | ICD-10-CM | POA: Insufficient documentation

## 2013-03-08 DIAGNOSIS — Y92410 Unspecified street and highway as the place of occurrence of the external cause: Secondary | ICD-10-CM | POA: Insufficient documentation

## 2013-03-08 DIAGNOSIS — Z8639 Personal history of other endocrine, nutritional and metabolic disease: Secondary | ICD-10-CM | POA: Insufficient documentation

## 2013-03-08 DIAGNOSIS — I10 Essential (primary) hypertension: Secondary | ICD-10-CM | POA: Insufficient documentation

## 2013-03-08 DIAGNOSIS — S0993XA Unspecified injury of face, initial encounter: Secondary | ICD-10-CM | POA: Insufficient documentation

## 2013-03-08 LAB — POCT I-STAT, CHEM 8
BUN: 15 mg/dL (ref 6–23)
Creatinine, Ser: 0.8 mg/dL (ref 0.50–1.10)
Hemoglobin: 15.3 g/dL — ABNORMAL HIGH (ref 12.0–15.0)
Potassium: 3.7 mEq/L (ref 3.5–5.1)
Sodium: 138 mEq/L (ref 135–145)
TCO2: 25 mmol/L (ref 0–100)

## 2013-03-08 MED ORDER — IOHEXOL 300 MG/ML  SOLN
100.0000 mL | Freq: Once | INTRAMUSCULAR | Status: AC | PRN
  Administered 2013-03-08: 100 mL via INTRAVENOUS

## 2013-03-08 MED ORDER — ONDANSETRON 4 MG PO TBDP
8.0000 mg | ORAL_TABLET | Freq: Once | ORAL | Status: AC
  Administered 2013-03-08: 8 mg via ORAL
  Filled 2013-03-08: qty 2

## 2013-03-08 MED ORDER — OXYCODONE-ACETAMINOPHEN 5-325 MG PO TABS: 1.0000 | ORAL_TABLET | ORAL | Status: DC | PRN

## 2013-03-08 MED ORDER — FENTANYL CITRATE 0.05 MG/ML IJ SOLN
100.0000 ug | Freq: Once | INTRAMUSCULAR | Status: AC
  Administered 2013-03-08: 100 ug via NASAL
  Filled 2013-03-08: qty 2
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
## 2013-03-08 NOTE — ED Provider Notes (Signed)
  Face-to-face evaluation  See earlier note today  History:   Physical exam:  Medical screening examination/treatment/procedure(s) were conducted as a shared visit with non-physician practitioner(s) and myself.  I personally evaluated the patient during the encounter  Flint Melter, MD 03/09/13 [phone]

## 2013-03-08 NOTE — ED Notes (Signed)
No problem with ambulation
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
## 2013-03-08 NOTE — ED Notes (Signed)
Patient transported to CT 

## 2013-03-09 NOTE — ED Provider Notes (Deleted)
Medical screening examination/treatment/procedure(s) were performed by non-physician practitioner and as supervising physician I was immediately available for consultation/collaboration.  Flint Melter, MD 03/09/13 [phone]

## 2013-03-16 ENCOUNTER — Ambulatory Visit (INDEPENDENT_AMBULATORY_CARE_PROVIDER_SITE_OTHER): Payer: BC Managed Care – PPO | Admitting: Family Medicine

## 2013-03-16 ENCOUNTER — Ambulatory Visit: Payer: BC Managed Care – PPO

## 2013-03-16 VITALS — BP 122/80 | HR 95 | Temp 97.6°F | Resp 16 | Ht 62.5 in | Wt 139.0 lb

## 2013-03-16 DIAGNOSIS — R0781 Pleurodynia: Secondary | ICD-10-CM

## 2013-03-16 DIAGNOSIS — R079 Chest pain, unspecified: Secondary | ICD-10-CM

## 2013-03-16 DIAGNOSIS — T148XXA Other injury of unspecified body region, initial encounter: Secondary | ICD-10-CM

## 2013-03-16 MED ORDER — MELOXICAM 15 MG PO TABS: 15.0000 mg | ORAL_TABLET | Freq: Every day | ORAL | Status: DC

## 2013-03-16 MED ORDER — OXYCODONE-ACETAMINOPHEN 5-325 MG PO TABS: 1.0000 | ORAL_TABLET | Freq: Every evening | ORAL | Status: DC | PRN

## 2013-03-16 NOTE — Patient Instructions (Signed)
I've instructed you to exercise gently over the next 7 days. I am referring you to a orthopedics. At this point I think it's reasonable to do some arm curls and extensions, avoiding raising your elbows over shoulder level. It's also safe to begin spinning on an exercise bicycle.  Need to avoid lifting anything over 10 pounds, raising arms over shoulder level, or doing prolonged walking or any other impact exercise such as treadmill work. This restriction should remain for the next 2 weeks.  Your pain may persist for over a month but usually after several weeks the pain is significantly better and he can start to return to your normal activities. Like you to return in one week so we can better evaluate your progress.
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
## 2013-03-20 ENCOUNTER — Telehealth: Payer: Self-pay

## 2013-03-20 NOTE — Telephone Encounter (Signed)
Pt states she is coming by about 1230 before her apt at a specialist and is needing a copy of her xrays Call back number is [phone]

## 2013-03-20 NOTE — Telephone Encounter (Signed)
Have given request to xray. 

## 2013-03-26 ENCOUNTER — Telehealth: Payer: Self-pay | Admitting: *Deleted

## 2013-03-26 NOTE — Progress Notes (Signed)
PA was approved for Adderall 20 mg 1 BID through 03/26/14, case # [id number].This was done as renewal and no one needs to be notified at this time.

## 2013-03-26 NOTE — Telephone Encounter (Signed)
PA FOR ADDERALL APPROVED FROM 03/05/13 THRU 03/26/14 CASE ID #[id number]

## 2013-03-28 ENCOUNTER — Ambulatory Visit (INDEPENDENT_AMBULATORY_CARE_PROVIDER_SITE_OTHER): Payer: BC Managed Care – PPO | Admitting: Emergency Medicine

## 2013-03-28 VITALS — BP 100/70 | HR 88 | Temp 98.1°F | Resp 20 | Ht 62.25 in | Wt 139.0 lb

## 2013-03-28 DIAGNOSIS — F988 Other specified behavioral and emotional disorders with onset usually occurring in childhood and adolescence: Secondary | ICD-10-CM

## 2013-03-28 DIAGNOSIS — S2000XD Contusion of breast, unspecified breast, subsequent encounter: Secondary | ICD-10-CM

## 2013-03-28 DIAGNOSIS — S2000XA Contusion of breast, unspecified breast, initial encounter: Secondary | ICD-10-CM

## 2013-03-28 MED ORDER — AMPHETAMINE-DEXTROAMPHETAMINE 20 MG PO TABS: 20.0000 mg | ORAL_TABLET | Freq: Two times a day (BID) | ORAL | Status: DC
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
## 2013-03-31 ENCOUNTER — Telehealth: Payer: Self-pay

## 2013-03-31 NOTE — Telephone Encounter (Signed)
She is not ready for a personal trainer yet. I let her know on her next visit when she is ready for a personal trainer.
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
## 2013-04-01 NOTE — Telephone Encounter (Signed)
Called her to advise.  

## 2013-04-04 ENCOUNTER — Ambulatory Visit (INDEPENDENT_AMBULATORY_CARE_PROVIDER_SITE_OTHER): Payer: BC Managed Care – PPO

## 2013-04-05 ENCOUNTER — Ambulatory Visit: Payer: BC Managed Care – PPO

## 2013-04-05 ENCOUNTER — Ambulatory Visit (INDEPENDENT_AMBULATORY_CARE_PROVIDER_SITE_OTHER): Payer: BC Managed Care – PPO | Admitting: Emergency Medicine

## 2013-04-05 VITALS — BP 122/82 | HR 102 | Temp 98.2°F | Resp 16 | Ht 62.5 in | Wt 137.8 lb

## 2013-04-05 DIAGNOSIS — R079 Chest pain, unspecified: Secondary | ICD-10-CM

## 2013-04-05 DIAGNOSIS — IMO0001 Reserved for inherently not codable concepts without codable children: Secondary | ICD-10-CM

## 2013-04-05 DIAGNOSIS — S2220XA Unspecified fracture of sternum, initial encounter for closed fracture: Secondary | ICD-10-CM | POA: Insufficient documentation

## 2013-04-05 DIAGNOSIS — R0781 Pleurodynia: Secondary | ICD-10-CM

## 2013-04-05 DIAGNOSIS — S42012D Anterior displaced fracture of sternal end of left clavicle, subsequent encounter for fracture with routine healing: Secondary | ICD-10-CM

## 2013-04-05 DIAGNOSIS — Z23 Encounter for immunization: Secondary | ICD-10-CM

## 2013-04-05 DIAGNOSIS — S2220XD Unspecified fracture of sternum, subsequent encounter for fracture with routine healing: Secondary | ICD-10-CM

## 2013-04-05 HISTORY — DX: Unspecified fracture of sternum, initial encounter for closed fracture: S22.20XA

## 2013-04-05 MED ORDER — OXYCODONE-ACETAMINOPHEN 5-325 MG PO TABS: 1.0000 | ORAL_TABLET | Freq: Every evening | ORAL | Status: DC | PRN
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
## 2013-04-07 ENCOUNTER — Telehealth: Payer: Self-pay
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
## 2013-04-08 NOTE — Telephone Encounter (Signed)
Called to advise, left message to advise.

## 2013-04-18 ENCOUNTER — Ambulatory Visit (INDEPENDENT_AMBULATORY_CARE_PROVIDER_SITE_OTHER): Payer: BC Managed Care – PPO | Admitting: Emergency Medicine

## 2013-04-18 ENCOUNTER — Ambulatory Visit: Payer: BC Managed Care – PPO

## 2013-04-18 VITALS — BP 120/84 | HR 86 | Temp 98.0°F | Resp 16 | Ht 63.0 in | Wt 139.0 lb

## 2013-04-18 DIAGNOSIS — S2220XS Unspecified fracture of sternum, sequela: Secondary | ICD-10-CM

## 2013-04-18 DIAGNOSIS — S42012D Anterior displaced fracture of sternal end of left clavicle, subsequent encounter for fracture with routine healing: Secondary | ICD-10-CM

## 2013-04-18 DIAGNOSIS — IMO0002 Reserved for concepts with insufficient information to code with codable children: Secondary | ICD-10-CM

## 2013-04-18 DIAGNOSIS — R0781 Pleurodynia: Secondary | ICD-10-CM

## 2013-04-18 DIAGNOSIS — R079 Chest pain, unspecified: Secondary | ICD-10-CM

## 2013-04-18 DIAGNOSIS — IMO0001 Reserved for inherently not codable concepts without codable children: Secondary | ICD-10-CM

## 2013-04-18 MED ORDER — OXYCODONE-ACETAMINOPHEN 5-325 MG PO TABS: 1.0000 | ORAL_TABLET | Freq: Every evening | ORAL | Status: DC | PRN
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
## 2013-04-28 ENCOUNTER — Ambulatory Visit
Admission: RE | Admit: 2013-04-28 | Discharge: 2013-04-28 | Disposition: A | Discharge status: Home / Self Care | Payer: BC Managed Care – PPO | Source: Ambulatory Visit | Attending: Emergency Medicine | Admitting: Emergency Medicine

## 2013-04-28 DIAGNOSIS — S2220XS Unspecified fracture of sternum, sequela: Secondary | ICD-10-CM

## 2013-04-28 IMAGING — CT CT CHEST W/O CM
4 of 7 series · 17 of 31 positions shown, 19 images · non-contrast
Comparison: Sternal views [DATE].  Chest CT [DATE].

CLINICAL DATA: Followup sternal fracture.  Continued pain.

EXAM:
CT CHEST WITHOUT CONTRAST
TECHNIQUE: Multidetector CT imaging of the chest was performed following the
standard protocol without IV contrast.

[Series 3: chest w/o · axial · non-contrast · 0.70mm/px · z∈[-179,-79]mm · 2 of 61 slices shown]
[im 21/61  lung]
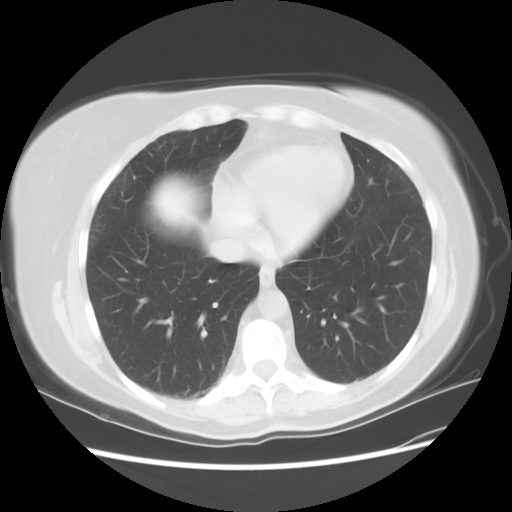
[im 41/61  lung]
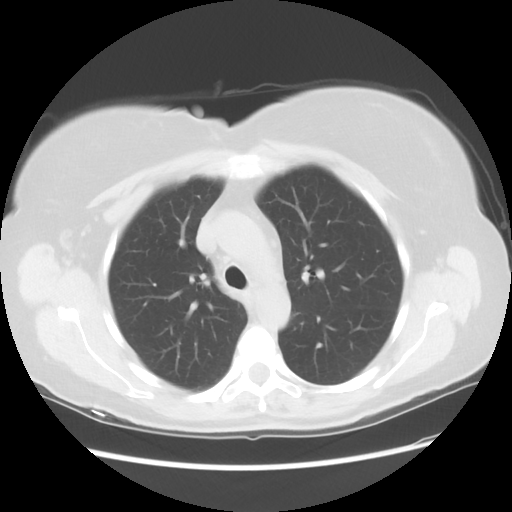

[Series 4: lung windows · axial · 0.70mm/px · z∈[-179,-79]mm · 2 of 61 slices shown]
[im 21/61  lung]
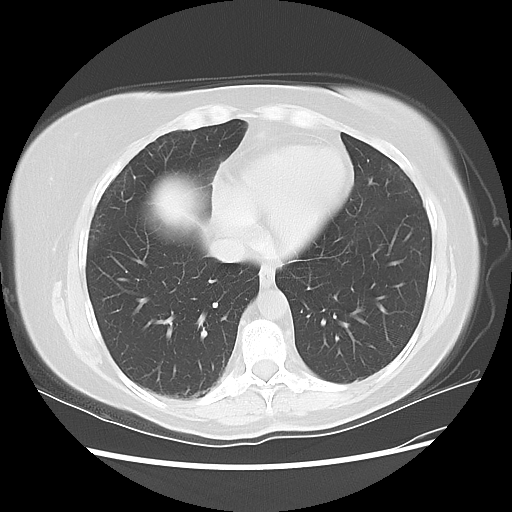
[im 41/61  lung]
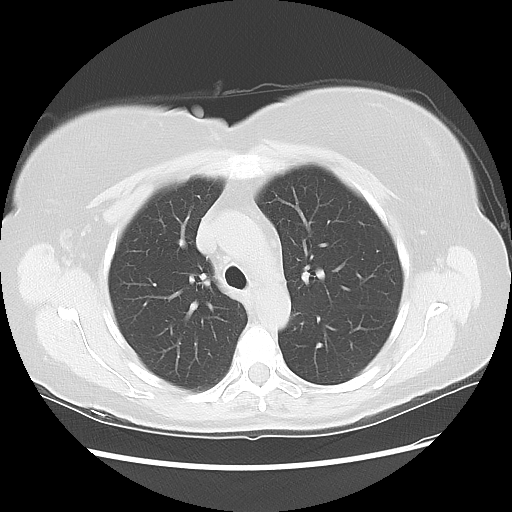

[Series 5: bone windows · axial · 0.70mm/px · z∈[-236,-24]mm · 6 of 121 slices shown, 8 images]
[im 18/121  mediastinal]
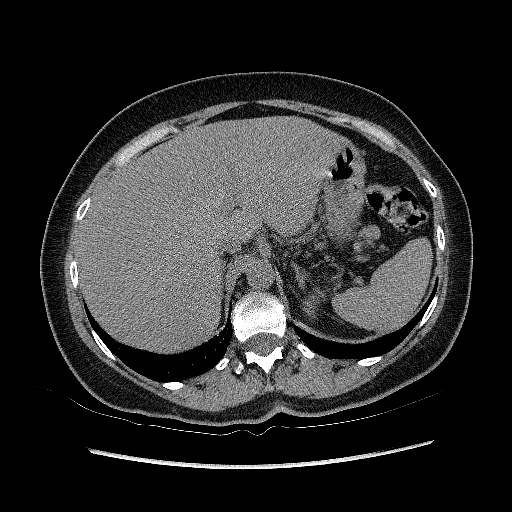
[im 18/121  lung]
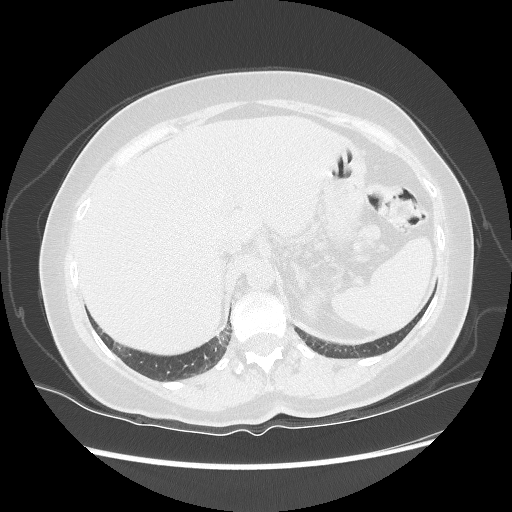
[im 35/121  lung]
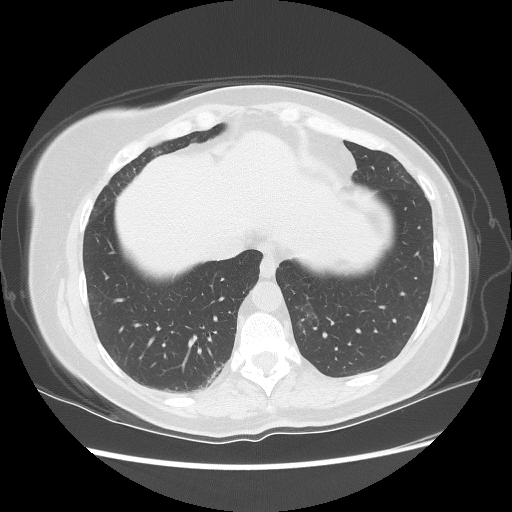
[im 52/121  lung]
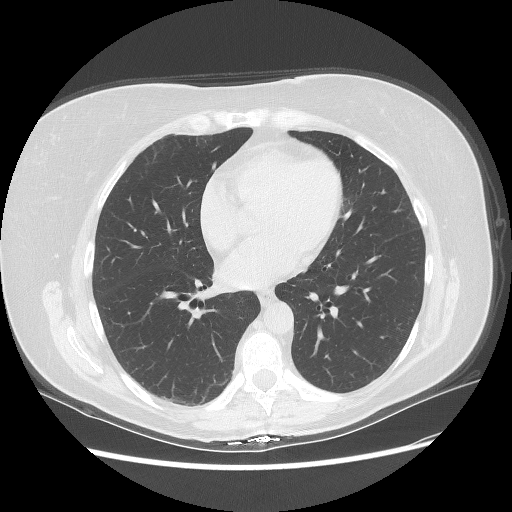
[im 69/121  lung]
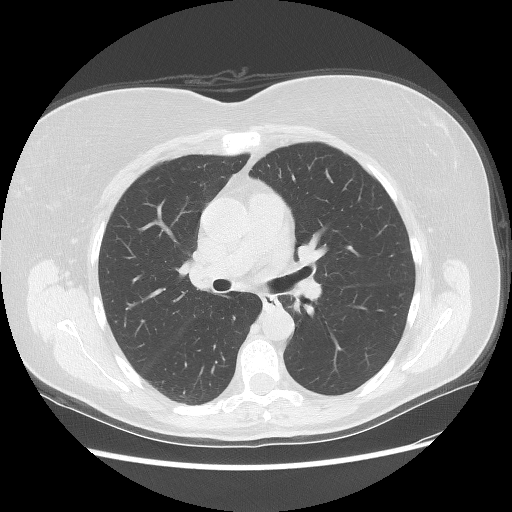
[im 86/121  mediastinal]
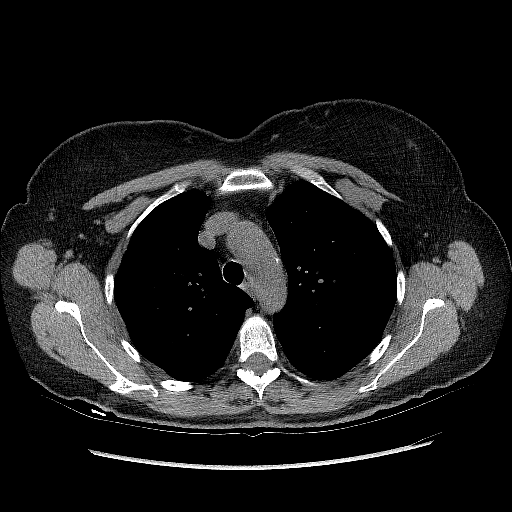
[im 86/121  lung]
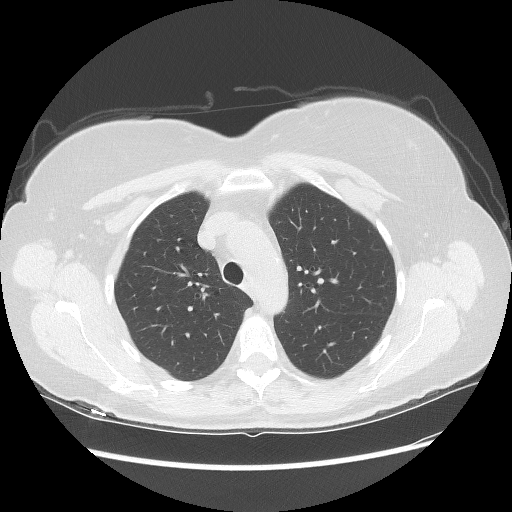
[im 103/121  lung]
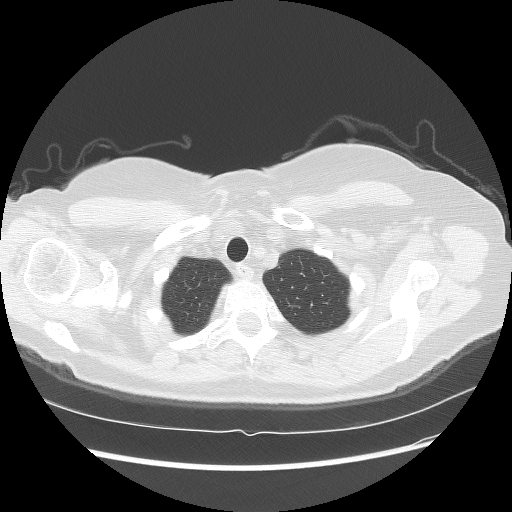

[Series 602: sagittal body · sagittal · 0.70mm/px · 7 of 145 slices shown]
[im 17/145  mediastinal]
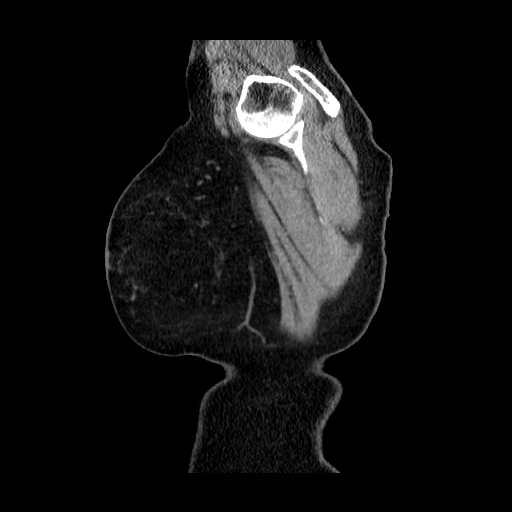
[im 33/145  mediastinal]
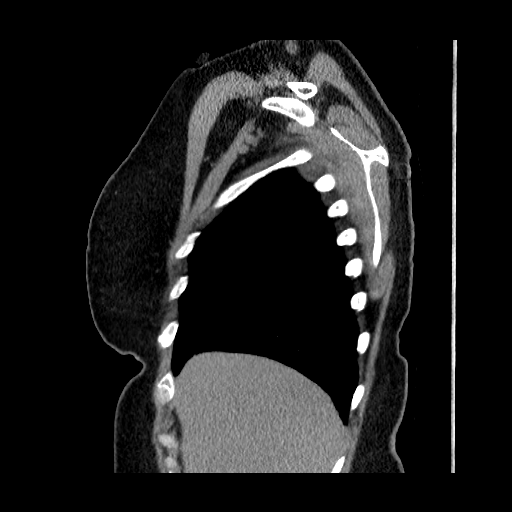
[im 49/145  mediastinal]
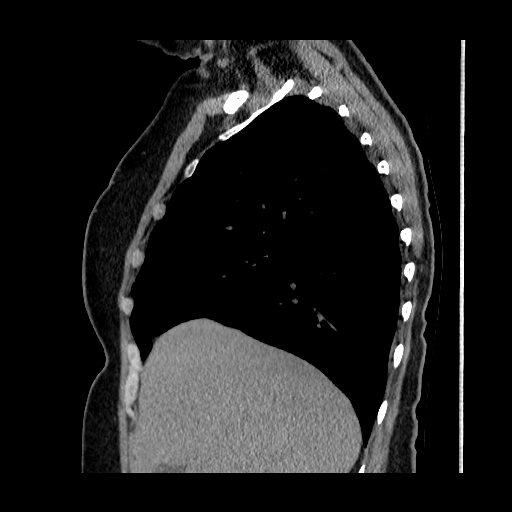
[im 65/145  mediastinal]
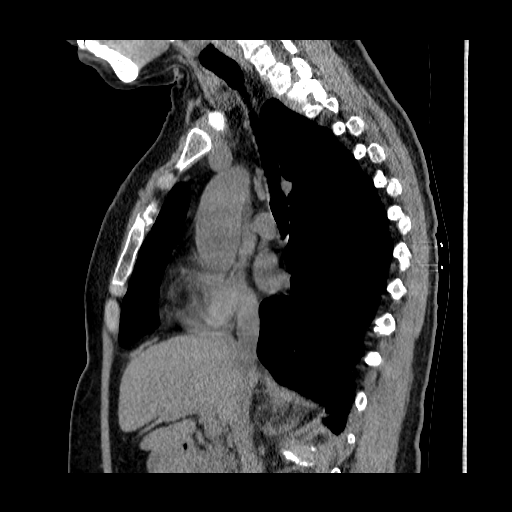
[im 81/145  mediastinal]
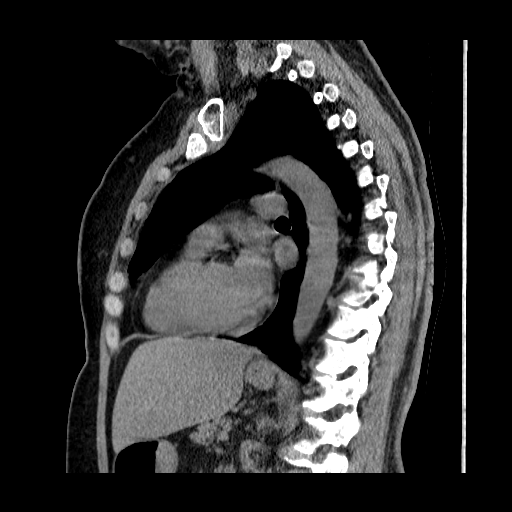
[im 97/145  mediastinal]
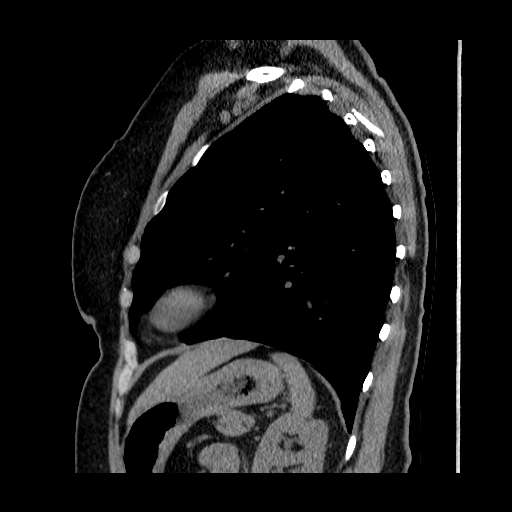
[im 113/145  mediastinal]
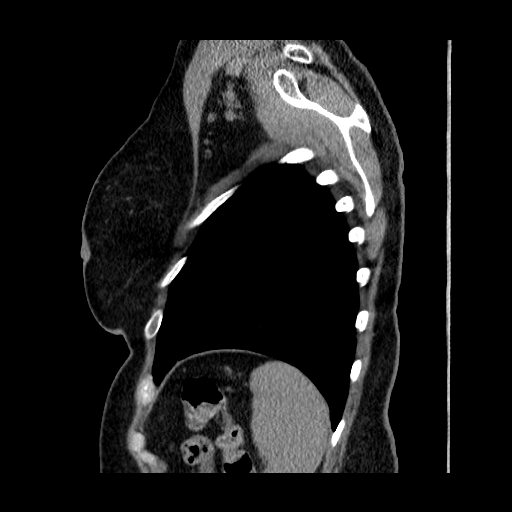

[17 of 31 positions shown; findings below may reference images not displayed]

FINDINGS: Ununited fracture noted through the upper to mid portion of the
sternum. Surrounding sclerosis and partially bridging callus noted.
Healing right anterior 5th through 7th rib fractures, not visible on
prior chest CT.

Lungs are clear. No focal airspace opacities or suspicious nodules.
No effusions. Heart is normal size. Aorta is normal caliber. No
mediastinal, hilar, or axillary adenopathy. Chest wall soft tissues
are unremarkable. Imaging into the upper abdomen shows no acute
findings.
IMPRESSION: Healing, but ununited mid sternal fracture.

Healing right anterior 5th through 7th rib fractures.

## 2013-05-01 ENCOUNTER — Ambulatory Visit (INDEPENDENT_AMBULATORY_CARE_PROVIDER_SITE_OTHER): Payer: BC Managed Care – PPO | Admitting: Emergency Medicine

## 2013-05-01 VITALS — BP 120/70 | HR 90 | Temp 98.0°F | Resp 16 | Ht 62.0 in | Wt 138.2 lb

## 2013-05-01 DIAGNOSIS — R079 Chest pain, unspecified: Secondary | ICD-10-CM

## 2013-05-01 DIAGNOSIS — F988 Other specified behavioral and emotional disorders with onset usually occurring in childhood and adolescence: Secondary | ICD-10-CM

## 2013-05-01 DIAGNOSIS — R0781 Pleurodynia: Secondary | ICD-10-CM

## 2013-05-01 DIAGNOSIS — IMO0001 Reserved for inherently not codable concepts without codable children: Secondary | ICD-10-CM

## 2013-05-01 DIAGNOSIS — S42012D Anterior displaced fracture of sternal end of left clavicle, subsequent encounter for fracture with routine healing: Secondary | ICD-10-CM

## 2013-05-01 MED ORDER — AMPHETAMINE-DEXTROAMPHETAMINE 20 MG PO TABS: 20.0000 mg | ORAL_TABLET | Freq: Two times a day (BID) | ORAL | Status: DC

## 2013-05-01 MED ORDER — OXYCODONE-ACETAMINOPHEN 5-325 MG PO TABS: 1.0000 | ORAL_TABLET | Freq: Every evening | ORAL | Status: DC | PRN

## 2013-05-01 NOTE — Patient Instructions (Signed)
Recheck in 1 month. 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 You are not released to do Yoga but can continue to walk.
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
## 2013-05-28 ENCOUNTER — Ambulatory Visit: Payer: BC Managed Care – PPO

## 2013-05-28 ENCOUNTER — Ambulatory Visit (INDEPENDENT_AMBULATORY_CARE_PROVIDER_SITE_OTHER): Payer: BC Managed Care – PPO | Admitting: Emergency Medicine

## 2013-05-28 VITALS — BP 132/78 | HR 98 | Temp 98.1°F | Resp 18 | Ht 62.0 in | Wt 136.0 lb

## 2013-05-28 DIAGNOSIS — S2220XS Unspecified fracture of sternum, sequela: Secondary | ICD-10-CM

## 2013-05-28 DIAGNOSIS — I10 Essential (primary) hypertension: Secondary | ICD-10-CM

## 2013-05-28 DIAGNOSIS — IMO0002 Reserved for concepts with insufficient information to code with codable children: Secondary | ICD-10-CM

## 2013-05-28 DIAGNOSIS — S2241XS Multiple fractures of ribs, right side, sequela: Secondary | ICD-10-CM

## 2013-05-28 DIAGNOSIS — IMO0001 Reserved for inherently not codable concepts without codable children: Secondary | ICD-10-CM

## 2013-05-28 DIAGNOSIS — S42012D Anterior displaced fracture of sternal end of left clavicle, subsequent encounter for fracture with routine healing: Secondary | ICD-10-CM

## 2013-05-28 DIAGNOSIS — R0781 Pleurodynia: Secondary | ICD-10-CM

## 2013-05-28 DIAGNOSIS — R079 Chest pain, unspecified: Secondary | ICD-10-CM

## 2013-05-28 MED ORDER — OXYCODONE-ACETAMINOPHEN 5-325 MG PO TABS: 1.0000 | ORAL_TABLET | Freq: Every evening | ORAL | Status: DC | PRN

## 2013-05-28 MED ORDER — LISINOPRIL-HYDROCHLOROTHIAZIDE 10-12.5 MG PO TABS: 1.0000 | ORAL_TABLET | Freq: Every day | ORAL | Status: DC
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
## 2013-06-09 ENCOUNTER — Ambulatory Visit (INDEPENDENT_AMBULATORY_CARE_PROVIDER_SITE_OTHER): Payer: BC Managed Care – PPO | Admitting: Emergency Medicine

## 2013-06-09 VITALS — BP 130/82 | HR 106 | Temp 98.1°F | Resp 16 | Ht 62.0 in | Wt 139.4 lb

## 2013-06-09 DIAGNOSIS — J209 Acute bronchitis, unspecified: Secondary | ICD-10-CM

## 2013-06-09 DIAGNOSIS — S2220XS Unspecified fracture of sternum, sequela: Secondary | ICD-10-CM

## 2013-06-09 DIAGNOSIS — S2241XK Multiple fractures of ribs, right side, subsequent encounter for fracture with nonunion: Secondary | ICD-10-CM

## 2013-06-09 DIAGNOSIS — IMO0002 Reserved for concepts with insufficient information to code with codable children: Secondary | ICD-10-CM

## 2013-06-09 MED ORDER — AZITHROMYCIN 250 MG PO TABS: ORAL_TABLET | ORAL | Status: DC

## 2013-06-09 MED ORDER — HYDROCOD POLST-CHLORPHEN POLST 10-8 MG/5ML PO LQCR: 5.0000 mL | Freq: Two times a day (BID) | ORAL | Status: DC | PRN
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
## 2013-06-09 NOTE — Patient Instructions (Signed)
Rib Fracture  A rib fracture is a break or crack in one of the bones of the ribs. The ribs are a group of long, curved bones that wrap around your chest and attach to your spine. They protect your lungs and other organs in the chest cavity. A broken or cracked rib is often painful, but most do not cause other problems. Most rib fractures heal on their own over time. However, rib fractures can be more serious if multiple ribs are broken or if broken ribs move out of place and push against other structures.  CAUSES   · A direct blow to the chest. For example, this could happen during contact sports, a car accident, or a fall against a hard object.  · Repetitive movements with high force, such as pitching a baseball or having severe coughing spells.  SYMPTOMS   · Pain when you breathe in or cough.  · Pain when someone presses on the injured area.  DIAGNOSIS   Your caregiver will perform a physical exam. Various imaging tests may be ordered to confirm the diagnosis and to look for related injuries. These tests may include a chest X-ray, computed tomography (CT), magnetic resonance imaging (MRI), or a bone scan.  TREATMENT   Rib fractures usually heal on their own in 1 3 months. The longer healing period is often associated with a continued cough or other aggravating activities. During the healing period, pain control is very important. Medication is usually given to control pain. Hospitalization or surgery may be needed for more severe injuries, such as those in which multiple ribs are broken or the ribs have moved out of place.   HOME CARE INSTRUCTIONS   · Avoid strenuous activity and any activities or movements that cause pain. Be careful during activities and avoid bumping the injured rib.  · Gradually increase activity as directed by your caregiver.  · Only take over-the-counter or prescription medications as directed by your caregiver. Do not take other medications without asking your caregiver first.  · Apply ice  to the injured area for the first 1 2 days after you have been treated or as directed by your caregiver. Applying ice helps to reduce inflammation and pain.  · Put ice in a plastic bag.  · Place a towel between your skin and the bag.    · Leave the ice on for 15 20 minutes at a time, every 2 hours while you are awake.  · Perform deep breathing as directed by your caregiver. This will help prevent pneumonia, which is a common complication of a broken rib. Your caregiver may instruct you to:  · Take deep breaths several times a day.  · Try to cough several times a day, holding a pillow against the injured area.  · Use a device called an incentive spirometer to practice deep breathing several times a day.  · Drink enough fluids to keep your urine clear or pale yellow. This will help you avoid constipation.    · Do not wear a rib belt or binder. These restrict breathing, which can lead to pneumonia.    SEEK IMMEDIATE MEDICAL CARE IF:   · You have a fever.    · You have difficulty breathing or shortness of breath.    · You develop a continual cough, or you cough up thick or bloody sputum.  · You feel sick to your stomach (nausea), throw up (vomit), or have abdominal pain.    · You have worsening pain not controlled with medications.      MAKE SURE YOU:  · Understand these instructions.  · Will watch your condition.  · Will get help right away if you are not doing well or get worse.  Document Released: 05/29/2005 Document Revised: 01/29/2013 Document Reviewed: 07/31/2012  [employer]® Patient Information ©2014 [employer].

## 2013-06-14 ENCOUNTER — Telehealth: Payer: Self-pay

## 2013-06-14 NOTE — Telephone Encounter (Signed)
Is there anything we can advise her to do?  She has been given Tussionex for her cough.
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
## 2013-06-16 MED ORDER — BENZONATATE 100 MG PO CAPS: 100.0000 mg | ORAL_CAPSULE | Freq: Three times a day (TID) | ORAL | Status: DC | PRN

## 2013-06-16 NOTE — Telephone Encounter (Signed)
Unfortunately Tussionex is the strongest cough medicine available.  I will send some Tessalon Perles that she can use as well.  Hopefully this helps.  If she is worsening or not improving, she should RTC

## 2013-06-16 NOTE — Telephone Encounter (Signed)
Called to advise.  

## 2013-06-23 ENCOUNTER — Ambulatory Visit (INDEPENDENT_AMBULATORY_CARE_PROVIDER_SITE_OTHER): Payer: BC Managed Care – PPO | Admitting: Emergency Medicine

## 2013-06-23 ENCOUNTER — Ambulatory Visit: Payer: BC Managed Care – PPO

## 2013-06-23 VITALS — BP 110/70 | HR 97 | Temp 98.0°F | Resp 16 | Ht 62.0 in | Wt 136.0 lb

## 2013-06-23 DIAGNOSIS — R059 Cough, unspecified: Secondary | ICD-10-CM

## 2013-06-23 DIAGNOSIS — F988 Other specified behavioral and emotional disorders with onset usually occurring in childhood and adolescence: Secondary | ICD-10-CM

## 2013-06-23 DIAGNOSIS — R05 Cough: Secondary | ICD-10-CM

## 2013-06-23 DIAGNOSIS — R062 Wheezing: Secondary | ICD-10-CM

## 2013-06-23 LAB — POCT CBC
GRANULOCYTE PERCENT: 43.4 % (ref 37–80)
HCT, POC: 47.6 % (ref 37.7–47.9)
HEMOGLOBIN: 15.1 g/dL (ref 12.2–16.2)
Lymph, poc: 2.7 (ref 0.6–3.4)
MCH: 29 pg (ref 27–31.2)
MCHC: 31.7 g/dL — AB (ref 31.8–35.4)
MCV: 91.6 fL (ref 80–97)
MID (cbc): 0.6 (ref 0–0.9)
MPV: 8.1 fL (ref 0–99.8)
POC GRANULOCYTE: 2.5 (ref 2–6.9)
POC LYMPH PERCENT: 46.6 %L (ref 10–50)
POC MID %: 10 %M (ref 0–12)
Platelet Count, POC: 297 10*3/uL (ref 142–424)
RBC: 5.2 M/uL (ref 4.04–5.48)
RDW, POC: 14 %
WBC: 5.8 10*3/uL (ref 4.6–10.2)

## 2013-06-23 LAB — POCT INFLUENZA A/B
INFLUENZA A, POC: NEGATIVE
INFLUENZA B, POC: NEGATIVE

## 2013-06-23 MED ORDER — HYDROCOD POLST-CHLORPHEN POLST 10-8 MG/5ML PO LQCR: 5.0000 mL | Freq: Two times a day (BID) | ORAL | Status: DC | PRN

## 2013-06-23 MED ORDER — AMPHETAMINE-DEXTROAMPHETAMINE 20 MG PO TABS: 20.0000 mg | ORAL_TABLET | Freq: Two times a day (BID) | ORAL | Status: DC
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
## 2013-06-23 NOTE — Patient Instructions (Signed)

## 2013-07-04 ENCOUNTER — Ambulatory Visit (INDEPENDENT_AMBULATORY_CARE_PROVIDER_SITE_OTHER): Payer: BC Managed Care – PPO | Admitting: Emergency Medicine

## 2013-07-04 VITALS — BP 110/80 | HR 103 | Temp 97.8°F | Resp 16 | Ht 62.5 in | Wt 136.0 lb

## 2013-07-04 DIAGNOSIS — S301XXA Contusion of abdominal wall, initial encounter: Secondary | ICD-10-CM

## 2013-07-04 DIAGNOSIS — S2249XA Multiple fractures of ribs, unspecified side, initial encounter for closed fracture: Secondary | ICD-10-CM

## 2013-07-04 DIAGNOSIS — S2220XA Unspecified fracture of sternum, initial encounter for closed fracture: Secondary | ICD-10-CM
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
## 2013-07-25 ENCOUNTER — Other Ambulatory Visit: Payer: Self-pay | Admitting: Family Medicine

## 2013-07-25 NOTE — Telephone Encounter (Signed)
Dr Everlene Farrier, it looks like Dr Tamala Julian wrote for this last, but this is your pt. Do you want to RF?

## 2013-07-30 ENCOUNTER — Telehealth: Payer: Self-pay | Admitting: Emergency Medicine

## 2013-07-30 DIAGNOSIS — F988 Other specified behavioral and emotional disorders with onset usually occurring in childhood and adolescence: Secondary | ICD-10-CM

## 2013-07-30 NOTE — Telephone Encounter (Signed)
Patient called requesting a refill on amphetamine-dextroamphetamine (ADDERALL) 20 MG tablet

## 2013-07-31 NOTE — Telephone Encounter (Signed)
Please print out 3 months of Adderall prescriptions and I will sign them.

## 2013-08-03 MED ORDER — AMPHETAMINE-DEXTROAMPHETAMINE 20 MG PO TABS: 20.0000 mg | ORAL_TABLET | Freq: Two times a day (BID) | ORAL | Status: DC

## 2013-08-03 NOTE — Telephone Encounter (Signed)
Patient notified and voiced understanding.

## 2013-08-03 NOTE — Telephone Encounter (Signed)
Printed and at [employer].

## 2013-08-27 ENCOUNTER — Other Ambulatory Visit: Payer: Self-pay | Admitting: Physician Assistant

## 2013-09-01 ENCOUNTER — Ambulatory Visit: Payer: BC Managed Care – PPO

## 2013-09-01 ENCOUNTER — Ambulatory Visit (INDEPENDENT_AMBULATORY_CARE_PROVIDER_SITE_OTHER): Payer: BC Managed Care – PPO | Admitting: Emergency Medicine

## 2013-09-01 DIAGNOSIS — S2220XA Unspecified fracture of sternum, initial encounter for closed fracture: Secondary | ICD-10-CM
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
## 2013-11-12 ENCOUNTER — Ambulatory Visit (INDEPENDENT_AMBULATORY_CARE_PROVIDER_SITE_OTHER): Payer: BC Managed Care – PPO | Admitting: Emergency Medicine

## 2013-11-12 VITALS — BP 120/80 | HR 104 | Temp 97.6°F | Resp 18 | Ht 62.0 in | Wt 142.0 lb

## 2013-11-12 DIAGNOSIS — R1310 Dysphagia, unspecified: Secondary | ICD-10-CM | POA: Insufficient documentation

## 2013-11-12 DIAGNOSIS — F988 Other specified behavioral and emotional disorders with onset usually occurring in childhood and adolescence: Secondary | ICD-10-CM

## 2013-11-12 MED ORDER — AMPHETAMINE-DEXTROAMPHETAMINE 20 MG PO TABS: 20.0000 mg | ORAL_TABLET | Freq: Two times a day (BID) | ORAL | Status: DC
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
## 2013-11-12 NOTE — Patient Instructions (Signed)
We will call you once we get results of your barium swallow

## 2013-11-27 ENCOUNTER — Ambulatory Visit
Admission: RE | Admit: 2013-11-27 | Discharge: 2013-11-27 | Disposition: A | Discharge status: Home / Self Care | Payer: BC Managed Care – PPO | Source: Ambulatory Visit | Attending: Emergency Medicine | Admitting: Emergency Medicine

## 2013-11-27 DIAGNOSIS — R1310 Dysphagia, unspecified: Secondary | ICD-10-CM

## 2013-11-28 ENCOUNTER — Other Ambulatory Visit: Payer: Self-pay | Admitting: Emergency Medicine

## 2013-12-19 ENCOUNTER — Ambulatory Visit (INDEPENDENT_AMBULATORY_CARE_PROVIDER_SITE_OTHER): Payer: BC Managed Care – PPO | Admitting: Emergency Medicine

## 2013-12-19 VITALS — BP 118/76 | HR 89 | Temp 97.7°F | Resp 16 | Ht 62.25 in | Wt 142.0 lb

## 2013-12-19 DIAGNOSIS — F988 Other specified behavioral and emotional disorders with onset usually occurring in childhood and adolescence: Secondary | ICD-10-CM

## 2013-12-19 DIAGNOSIS — IMO0002 Reserved for concepts with insufficient information to code with codable children: Secondary | ICD-10-CM

## 2013-12-19 DIAGNOSIS — S2249XA Multiple fractures of ribs, unspecified side, initial encounter for closed fracture: Secondary | ICD-10-CM | POA: Insufficient documentation

## 2013-12-19 DIAGNOSIS — IMO0001 Reserved for inherently not codable concepts without codable children: Secondary | ICD-10-CM

## 2013-12-19 DIAGNOSIS — F909 Attention-deficit hyperactivity disorder, unspecified type: Secondary | ICD-10-CM

## 2013-12-19 DIAGNOSIS — S2241XK Multiple fractures of ribs, right side, subsequent encounter for fracture with nonunion: Secondary | ICD-10-CM

## 2013-12-19 DIAGNOSIS — S2241XD Multiple fractures of ribs, right side, subsequent encounter for fracture with routine healing: Secondary | ICD-10-CM

## 2013-12-19 HISTORY — DX: Multiple fractures of ribs, unspecified side, initial encounter for closed fracture: S22.49XA

## 2013-12-19 MED ORDER — AMPHETAMINE-DEXTROAMPHETAMINE 20 MG PO TABS: 20.0000 mg | ORAL_TABLET | Freq: Two times a day (BID) | ORAL | Status: DC
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
## 2014-01-02 ENCOUNTER — Other Ambulatory Visit: Payer: Self-pay | Admitting: Emergency Medicine

## 2014-01-06 DIAGNOSIS — Z0271 Encounter for disability determination: Secondary | ICD-10-CM

## 2014-03-12 ENCOUNTER — Telehealth: Payer: Self-pay

## 2014-03-12 NOTE — Telephone Encounter (Signed)
Pt dropped off letter stating that PA is about to expire on adderall. I completed PA over the phone and received approval through 03/12/15. Notified pt.

## 2014-04-16 ENCOUNTER — Other Ambulatory Visit: Payer: Self-pay | Admitting: Emergency Medicine

## 2014-04-20 NOTE — Telephone Encounter (Signed)
Called in Rx

## 2014-04-22 ENCOUNTER — Telehealth: Payer: Self-pay

## 2014-04-22 NOTE — Telephone Encounter (Signed)
The patient is requesting a refill of her Aderrall prescription.

## 2014-04-22 NOTE — Telephone Encounter (Signed)
The patient is requesting a refill of her Adderall prescription.  The patient's CB#: [phone].  Thank you.

## 2014-04-23 ENCOUNTER — Other Ambulatory Visit: Payer: Self-pay | Admitting: Emergency Medicine

## 2014-04-23 DIAGNOSIS — F988 Other specified behavioral and emotional disorders with onset usually occurring in childhood and adolescence: Secondary | ICD-10-CM

## 2014-04-23 MED ORDER — AMPHETAMINE-DEXTROAMPHETAMINE 20 MG PO TABS: 20.0000 mg | ORAL_TABLET | Freq: Two times a day (BID) | ORAL | Status: DC

## 2014-04-23 NOTE — Telephone Encounter (Signed)
Notified pt Rxs are ready. 

## 2014-05-23 ENCOUNTER — Other Ambulatory Visit: Payer: Self-pay | Admitting: Emergency Medicine

## 2014-06-26 ENCOUNTER — Other Ambulatory Visit: Payer: Self-pay | Admitting: Emergency Medicine

## 2014-06-29 ENCOUNTER — Ambulatory Visit (INDEPENDENT_AMBULATORY_CARE_PROVIDER_SITE_OTHER): Payer: BC Managed Care – PPO | Admitting: Physician Assistant

## 2014-06-29 ENCOUNTER — Encounter: Payer: Self-pay | Admitting: Physician Assistant

## 2014-06-29 ENCOUNTER — Other Ambulatory Visit: Payer: Self-pay | Admitting: Physician Assistant

## 2014-06-29 VITALS — BP 110/70 | HR 102 | Temp 97.6°F | Resp 16 | Ht 62.75 in | Wt 137.0 lb

## 2014-06-29 DIAGNOSIS — J309 Allergic rhinitis, unspecified: Secondary | ICD-10-CM

## 2014-06-29 DIAGNOSIS — I10 Essential (primary) hypertension: Secondary | ICD-10-CM

## 2014-06-29 DIAGNOSIS — F909 Attention-deficit hyperactivity disorder, unspecified type: Secondary | ICD-10-CM

## 2014-06-29 DIAGNOSIS — Z23 Encounter for immunization: Secondary | ICD-10-CM

## 2014-06-29 DIAGNOSIS — F988 Other specified behavioral and emotional disorders with onset usually occurring in childhood and adolescence: Secondary | ICD-10-CM

## 2014-06-29 LAB — COMPREHENSIVE METABOLIC PANEL
ALK PHOS: 100 U/L (ref 39–117)
ALT: 59 U/L — ABNORMAL HIGH (ref 0–35)
AST: 56 U/L — ABNORMAL HIGH (ref 0–37)
Albumin: 4.6 g/dL (ref 3.5–5.2)
BUN: 17 mg/dL (ref 6–23)
CALCIUM: 9.8 mg/dL (ref 8.4–10.5)
CHLORIDE: 100 meq/L (ref 96–112)
CO2: 25 mEq/L (ref 19–32)
CREATININE: 0.92 mg/dL (ref 0.50–1.10)
GLUCOSE: 100 mg/dL — AB (ref 70–99)
POTASSIUM: 4.1 meq/L (ref 3.5–5.3)
Sodium: 137 mEq/L (ref 135–145)
TOTAL PROTEIN: 7.6 g/dL (ref 6.0–8.3)
Total Bilirubin: 1.2 mg/dL (ref 0.2–1.2)

## 2014-06-29 MED ORDER — LISINOPRIL-HYDROCHLOROTHIAZIDE 10-12.5 MG PO TABS: 1.0000 | ORAL_TABLET | Freq: Every day | ORAL | Status: DC

## 2014-06-29 MED ORDER — AMPHETAMINE-DEXTROAMPHETAMINE 20 MG PO TABS: 20.0000 mg | ORAL_TABLET | Freq: Two times a day (BID) | ORAL | Status: DC

## 2014-06-29 MED ORDER — IPRATROPIUM BROMIDE 0.03 % NA SOLN: 2.0000 | Freq: Two times a day (BID) | NASAL | Status: DC | PRN

## 2014-06-29 MED ORDER — FLUTICASONE PROPIONATE 50 MCG/ACT NA SUSP: 2.0000 | Freq: Every day | NASAL | Status: DC | PRN

## 2014-06-29 NOTE — Patient Instructions (Signed)
Call for refills of adderall. Return in 6 months for annual exam with Dr. Everlene Farrier. Return with any problems/concerns.
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
## 2014-07-06 LAB — HEPATITIS C ANTIBODY: HCV Ab: NEGATIVE

## 2014-07-06 LAB — HEPATITIS B SURFACE ANTIGEN: Hepatitis B Surface Ag: NEGATIVE

## 2014-07-08 ENCOUNTER — Other Ambulatory Visit: Payer: Self-pay | Admitting: Physician Assistant

## 2014-07-08 DIAGNOSIS — Z13220 Encounter for screening for lipoid disorders: Secondary | ICD-10-CM

## 2014-07-08 DIAGNOSIS — R7989 Other specified abnormal findings of blood chemistry: Secondary | ICD-10-CM

## 2014-07-08 DIAGNOSIS — R945 Abnormal results of liver function studies: Secondary | ICD-10-CM

## 2014-11-06 ENCOUNTER — Encounter: Payer: Self-pay | Admitting: *Deleted

## 2014-11-23 ENCOUNTER — Other Ambulatory Visit: Payer: Self-pay | Admitting: Emergency Medicine

## 2014-11-23 ENCOUNTER — Other Ambulatory Visit: Payer: Self-pay

## 2014-11-23 DIAGNOSIS — F988 Other specified behavioral and emotional disorders with onset usually occurring in childhood and adolescence: Secondary | ICD-10-CM

## 2014-11-23 MED ORDER — AMPHETAMINE-DEXTROAMPHETAMINE 20 MG PO TABS: ORAL_TABLET | ORAL | Status: DC

## 2014-11-23 NOTE — Telephone Encounter (Signed)
I gave the patient a prescription for 1 month. She needs to see me sometime in the next month while she is on Adderall we need to see her at least every 6 months to discuss

## 2014-11-23 NOTE — Telephone Encounter (Signed)
Pt would like a refill on her amphetamine-dextroamphetamine (ADDERALL) 20 MG tablet [[id number]] . Please advise at [phone]

## 2014-11-24 NOTE — Telephone Encounter (Signed)
Left message on machine letting pt know.

## 2014-12-03 ENCOUNTER — Other Ambulatory Visit: Payer: Self-pay | Admitting: Emergency Medicine

## 2014-12-05 ENCOUNTER — Other Ambulatory Visit: Payer: Self-pay | Admitting: Emergency Medicine

## 2014-12-06 NOTE — Telephone Encounter (Signed)
Please check I believe this was sent yesterday

## 2014-12-06 NOTE — Telephone Encounter (Signed)
Please check I think this was already done yesterday

## 2014-12-07 NOTE — Telephone Encounter (Signed)
Faxed

## 2014-12-07 NOTE — Telephone Encounter (Signed)
Yes, I faxed this today from Rx written on 12/05/14.

## 2015-01-14 ENCOUNTER — Ambulatory Visit (INDEPENDENT_AMBULATORY_CARE_PROVIDER_SITE_OTHER): Payer: BC Managed Care – PPO | Admitting: Emergency Medicine

## 2015-01-14 ENCOUNTER — Encounter: Payer: Self-pay | Admitting: Emergency Medicine

## 2015-01-14 VITALS — BP 131/89 | HR 99 | Temp 98.4°F | Resp 16 | Ht 63.0 in | Wt 138.2 lb

## 2015-01-14 DIAGNOSIS — Z23 Encounter for immunization: Secondary | ICD-10-CM

## 2015-01-14 DIAGNOSIS — Z1211 Encounter for screening for malignant neoplasm of colon: Secondary | ICD-10-CM

## 2015-01-14 DIAGNOSIS — E785 Hyperlipidemia, unspecified: Secondary | ICD-10-CM | POA: Diagnosis not present

## 2015-01-14 DIAGNOSIS — F909 Attention-deficit hyperactivity disorder, unspecified type: Secondary | ICD-10-CM

## 2015-01-14 DIAGNOSIS — F988 Other specified behavioral and emotional disorders with onset usually occurring in childhood and adolescence: Secondary | ICD-10-CM

## 2015-01-14 DIAGNOSIS — I10 Essential (primary) hypertension: Secondary | ICD-10-CM

## 2015-01-14 LAB — CBC WITH DIFFERENTIAL/PLATELET
Basophils Absolute: 0.1 10*3/uL (ref 0.0–0.1)
Basophils Relative: 1 % (ref 0–1)
Eosinophils Absolute: 0.2 10*3/uL (ref 0.0–0.7)
Eosinophils Relative: 3 % (ref 0–5)
HCT: 47.7 % — ABNORMAL HIGH (ref 36.0–46.0)
Hemoglobin: 16 g/dL — ABNORMAL HIGH (ref 12.0–15.0)
LYMPHS ABS: 2.1 10*3/uL (ref 0.7–4.0)
LYMPHS PCT: 35 % (ref 12–46)
MCH: 29.5 pg (ref 26.0–34.0)
MCHC: 33.5 g/dL (ref 30.0–36.0)
MCV: 87.8 fL (ref 78.0–100.0)
MONO ABS: 0.4 10*3/uL (ref 0.1–1.0)
MPV: 9.3 fL (ref 8.6–12.4)
Monocytes Relative: 7 % (ref 3–12)
NEUTROS PCT: 54 % (ref 43–77)
Neutro Abs: 3.3 10*3/uL (ref 1.7–7.7)
PLATELETS: 281 10*3/uL (ref 150–400)
RBC: 5.43 MIL/uL — AB (ref 3.87–5.11)
RDW: 14.5 % (ref 11.5–15.5)
WBC: 6.1 10*3/uL (ref 4.0–10.5)

## 2015-01-14 LAB — COMPLETE METABOLIC PANEL WITH GFR
ALBUMIN: 4.6 g/dL (ref 3.6–5.1)
ALK PHOS: 75 U/L (ref 33–130)
ALT: 68 U/L — ABNORMAL HIGH (ref 6–29)
AST: 58 U/L — ABNORMAL HIGH (ref 10–35)
BUN: 15 mg/dL (ref 7–25)
CO2: 27 mmol/L (ref 20–31)
Calcium: 10.1 mg/dL (ref 8.6–10.4)
Chloride: 103 mmol/L (ref 98–110)
Creat: 0.8 mg/dL (ref 0.50–0.99)
GFR, Est Non African American: 80 mL/min (ref >=60)
GLUCOSE: 98 mg/dL (ref 65–99)
Potassium: 5.5 mmol/L — ABNORMAL HIGH (ref 3.5–5.3)
Sodium: 142 mmol/L (ref 135–146)
Total Bilirubin: 1 mg/dL (ref 0.2–1.2)
Total Protein: 7.1 g/dL (ref 6.1–8.1)

## 2015-01-14 LAB — LIPID PANEL
Cholesterol: 273 mg/dL — ABNORMAL HIGH (ref 125–200)
HDL: 76 mg/dL (ref >=46)
LDL Cholesterol: 158 mg/dL — ABNORMAL HIGH (ref <130)
Total CHOL/HDL Ratio: 3.6 Ratio (ref <=5.0)
Triglycerides: 195 mg/dL — ABNORMAL HIGH (ref <150)
VLDL: 39 mg/dL — ABNORMAL HIGH (ref <30)

## 2015-01-14 LAB — TSH: TSH: 1.071 u[IU]/mL (ref 0.350–4.500)

## 2015-01-14 MED ORDER — ZOSTER VACCINE LIVE 19400 UNT/0.65ML ~~LOC~~ SOLR: 0.6500 mL | Freq: Once | SUBCUTANEOUS | Status: DC

## 2015-01-14 MED ORDER — AMPHETAMINE-DEXTROAMPHETAMINE 20 MG PO TABS: ORAL_TABLET | ORAL | Status: DC

## 2015-01-14 MED ORDER — AMPHETAMINE-DEXTROAMPHETAMINE 10 MG PO TABS: 10.0000 mg | ORAL_TABLET | Freq: Every day | ORAL | Status: DC

## 2015-01-14 NOTE — Addendum Note (Signed)
Addended by: Wyatt Haste on: 01/14/2015 09:53 AM   Modules accepted: Orders

## 2015-01-14 NOTE — Progress Notes (Addendum)
This chart was scribed for [name], MD by Marti Sleigh, Medical Scribe. This patient was seen in Room 21 and the patient's care was started at 8:13 AM.  Chief Complaint:  Chief Complaint  Patient presents with  . Medication Refill   HPI: Katrina Valdez is a 59 y.o. female who reports to [facility] today reporting for a medication refill. She states she has reduced the use of her Adderoll. To once per day, several days per week. She state has some right knee pain due to a suspected torn meniscus, that was injured many years ago. Pt has never had a colonoscopy. Pt is due for a mammogram.    Past Medical History  Diagnosis Date  . Hypertension   . Fibromyalgia 1997  . ADHD (attention deficit hyperactivity disorder)   . Allergy   . Anxiety   . Hyperlipidemia    Past Surgical History  Procedure Laterality Date  . Tubal ligation    . Hand surgery  2011  . Tonsills remov  1973  . Sepsis    . Bunionectomy    . Uterine ablation     History   Social History  . Marital Status: Married    Spouse Name: N/A  . Number of Children: N/A  . Years of Education: N/A   Occupational History  . retired    Social History Main Topics  . Smoking status: Former Smoker    Quit date: 11/22/1992  . Smokeless tobacco: Never Used  . Alcohol Use: Yes     Comment: daily 2 drinks  . Drug Use: No  . Sexual Activity: Yes    Birth Control/ Protection: None     Comment: number of sex partners in the last 31 months 1   Other Topics Concern  . None   Social History Narrative   Marital: married x 33 years; happily married.      Children:  2 children; 2 grandchildren.      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      Exercise:  [place] 4 days per week   Family History  Problem Relation Age of Onset  . Stroke Mother 57    hemorrhagic CVA x 2  . Hypertension Mother   . Heart disease  Mother 16    AMI  . Hyperlipidemia Son   . Cancer Paternal Aunt   . Heart disease Father   . Heart disease Daughter     monitoring cardiac   Allergies  Allergen Reactions  . Codeine Nausea And Vomiting  . Penicillins Swelling  . Sulfa Antibiotics Nausea Only   Prior to Admission medications   Medication Sig Start Date End Date Taking? Authorizing Provider  ALPRAZolam (XANAX) 0.5 MG tablet TAKE 1/2 TO 1 TABLET BY MOUTH 2 TIMES A DAY AS NEEDED FOR ANXIETY. 12/05/14  Yes Darlyne Russian, MD  amphetamine-dextroamphetamine (ADDERALL) 20 MG tablet Take 1 tablet twice a day 01/14/15  Yes Darlyne Russian, MD  Flaxseed, Linseed, (FLAX SEED OIL) 1000 MG CAPS Take 1,000 mg by mouth daily.    Yes Historical Provider, MD  fluticasone (FLONASE) 50 MCG/ACT nasal spray Place 2 sprays into both nostrils daily as needed for rhinitis or allergies. 06/29/14  Yes Bennett Scrape V, PA-C  ipratropium (ATROVENT) 0.03 % nasal spray Place 2 sprays into the nose 2 (two) times daily as needed for rhinitis.  06/29/14  Yes Nicole Bush V, PA-C  KLS ALLER-TEC 10 MG tablet TAKE 1 TABLET BY MOUTH DAILY.   Yes Darlyne Russian, MD  lisinopril-hydrochlorothiazide (PRINZIDE,ZESTORETIC) 10-12.5 MG per tablet Take 1 tablet by mouth daily. 06/29/14  Yes Bennett Scrape V, PA-C  Magnesium 250 MG TABS Take by mouth.   Yes Historical Provider, MD  zoster vaccine live, PF, (ZOSTAVAX) 81191 UNT/0.65ML injection Inject 19,400 Units into the skin once. 01/14/15   Darlyne Russian, MD     ROS: The patient denies fevers, chills, night sweats, unintentional weight loss, palpitations, wheezing, dyspnea on exertion, nausea, vomiting, abdominal pain, dysuria, hematuria, melena, numbness, weakness, or tingling.  All other systems have been reviewed and were otherwise negative with the exception of those mentioned in the HPI and as above.    PHYSICAL EXAM: Filed Vitals:   01/14/15 0803  BP: 131/89  Pulse: 99  Temp: 98.4 F (36.9 C)  Resp: 16   Body mass  index is 24.49 kg/(m^2).   General: Alert, no acute distress HEENT:  Normocephalic, atraumatic, oropharynx patent. Eye: Juliette Mangle [facility] Cardiovascular:  Regular rate and rhythm, no rubs murmurs or gallops.  No Carotid bruits, radial pulse intact. No pedal edema.  Respiratory: Clear to auscultation bilaterally.  No wheezes, rales, or rhonchi.  No cyanosis, no use of accessory musculature Abdominal: No organomegaly, abdomen is soft and non-tender, positive bowel sounds.  No masses. Musculoskeletal: Gait intact. No edema, mild tenderness on the left side of her sternum  Skin: No rashes. Neurologic: Facial musculature symmetric. Psychiatric: Patient acts appropriately throughout our interaction. Lymphatic: No cervical or submandibular lymphadenopathy Genitourinary/Anorectal: No acute findings   LABS: Results for orders placed or performed in visit on 06/29/14  Comprehensive metabolic panel  Result Value Ref Range   Sodium 137 135 - 145 mEq/L   Potassium 4.1 3.5 - 5.3 mEq/L   Chloride 100 96 - 112 mEq/L   CO2 25 19 - 32 mEq/L   Glucose, Bld 100 (H) 70 - 99 mg/dL   BUN 17 6 - 23 mg/dL   Creat 0.92 0.50 - 1.10 mg/dL   Total Bilirubin 1.2 0.2 - 1.2 mg/dL   Alkaline Phosphatase 100 39 - 117 U/L   AST 56 (H) 0 - 37 U/L   ALT 59 (H) 0 - 35 U/L   Total Protein 7.6 6.0 - 8.3 g/dL   Albumin 4.6 3.5 - 5.2 g/dL   Calcium 9.8 8.4 - 10.5 mg/dL     EKG/XRAY:   Primary read interpreted by Dr. Everlene Farrier at [facility].   ASSESSMENT/PLAN: I discussed her ADD medicines. She is currently taking only 1 tablet and does not do that every day. She is trying to wean off of her medications. Has to take it some days. She does understand the risks of staying on the medication regularly. She is agreeable to have a colonoscopy scheduled. She is going to schedule her own mammogram. I encouraged her to see her GYN on a regular basis. Routine labs were done today to follow-up on her elevated liver tests found on her last  checkup as well as electrolytes since she is on blood pressure medications. Repeat blood pressure was at goal.   Gross sideeffects, risk and benefits, and alternatives of medications d/w patient. Patient is aware that all medications have potential sideeffects and we are unable to predict every sideeffect or drug-drug interaction that may occur.  [name] MD 01/14/2015 8:13 AM

## 2015-01-15 ENCOUNTER — Encounter: Payer: Self-pay | Admitting: Internal Medicine

## 2015-01-19 ENCOUNTER — Telehealth: Payer: Self-pay | Admitting: *Deleted

## 2015-01-19 ENCOUNTER — Other Ambulatory Visit: Payer: Self-pay | Admitting: *Deleted

## 2015-01-19 DIAGNOSIS — R945 Abnormal results of liver function studies: Principal | ICD-10-CM

## 2015-01-19 DIAGNOSIS — R7989 Other specified abnormal findings of blood chemistry: Secondary | ICD-10-CM

## 2015-01-19 NOTE — Telephone Encounter (Signed)
-----   Message from Constance Goltz, [place] sent at 01/18/2015  3:09 PM EDT -----   ----- Message -----    From: Darlyne Russian, MD    Sent: 01/16/2015   9:44 AM      To: [facility] Clinical Message Pool  Her potassium is slightly elevated please limit potassium in her diet. Her cholesterol is up to 273 with an LDL of 158 she needs to be on a statin. If she is willing call-in Crestor 10 mg 1 every evening. She can have #30 tablets. She can have 6 refills. I would also advise she have an ultrasound of her liver. The elevated liver tests could certainly be due to her high cholesterol. They need to be repeated once her cholesterol is under control.

## 2015-01-19 NOTE — Telephone Encounter (Signed)
Pt would like to know if Crestor is the statin that has the least sided effects because she has fibromyalgia?  If not can she get something else.

## 2015-01-19 NOTE — Telephone Encounter (Signed)
Please advise that all patients tolerate medication differently.  Advise that she start the statin and be vigilant, and report side effects to Dr. Everlene Farrier. Philis Fendt, MS, PA-C   5:38 PM, 01/19/2015

## 2015-01-22 MED ORDER — ROSUVASTATIN CALCIUM 10 MG PO TABS: 10.0000 mg | ORAL_TABLET | Freq: Every day | ORAL | Status: DC

## 2015-01-22 NOTE — Telephone Encounter (Signed)
Spoke with pt. She will try the Crestor and report any problems. Rx sent to pharm

## 2015-01-22 NOTE — Addendum Note (Signed)
Addended by: Constance Goltz on: 01/22/2015 08:48 AM   Modules accepted: Orders

## 2015-03-11 ENCOUNTER — Telehealth: Payer: Self-pay | Admitting: Family Medicine

## 2015-03-11 DIAGNOSIS — Z1231 Encounter for screening mammogram for malignant neoplasm of breast: Secondary | ICD-10-CM

## 2015-03-11 NOTE — Telephone Encounter (Signed)
Spoke with patient and she is going to call [facility] and schedule her mammogram to be done before her CPE in December.  She also is having a colonoscopy in October.

## 2015-03-16 ENCOUNTER — Encounter: Payer: Self-pay | Admitting: Emergency Medicine

## 2015-03-17 ENCOUNTER — Telehealth: Payer: Self-pay

## 2015-03-17 NOTE — Telephone Encounter (Signed)
Exp scripts faxed notice that PA is about to expire for Adderall 20 mg. I completed PA form on covermymeds and received approval through 03/16/16, case # [id number]. Notified pharm.

## 2015-03-22 ENCOUNTER — Ambulatory Visit (AMBULATORY_SURGERY_CENTER): Payer: Self-pay

## 2015-03-22 VITALS — Ht 63.5 in | Wt 142.0 lb

## 2015-03-22 DIAGNOSIS — Z1211 Encounter for screening for malignant neoplasm of colon: Secondary | ICD-10-CM

## 2015-03-22 MED ORDER — NA SULFATE-K SULFATE-MG SULF 17.5-3.13-1.6 GM/177ML PO SOLN: 1.0000 | Freq: Once | ORAL | Status: DC

## 2015-03-22 NOTE — Progress Notes (Signed)
No egg or soy allergies Not on home 02 No previous anesthesia complications No diet or weight loss meds 

## 2015-03-29 ENCOUNTER — Telehealth: Payer: Self-pay | Admitting: Internal Medicine

## 2015-03-29 NOTE — Telephone Encounter (Signed)
Pt states she has purple and gold  Gel nail polish on her acrylic nails since she ruined them doing house work.  She states it will cost her 50$ to have this removed for her colonoscopy and she wishes to not have to do this.  Instructed pt she may leave on her nail polish and we can use her ear for her sat monitor if needed.  Pt verbalized understanding of instructions given today  Lelan Pons PV

## 2015-04-05 ENCOUNTER — Encounter: Payer: Self-pay | Admitting: Internal Medicine

## 2015-04-05 ENCOUNTER — Ambulatory Visit (AMBULATORY_SURGERY_CENTER): Payer: BC Managed Care – PPO | Admitting: Internal Medicine

## 2015-04-05 VITALS — BP 113/82 | HR 81 | Temp 96.9°F | Resp 25 | Ht 63.5 in | Wt 142.0 lb

## 2015-04-05 DIAGNOSIS — D120 Benign neoplasm of cecum: Secondary | ICD-10-CM

## 2015-04-05 DIAGNOSIS — K635 Polyp of colon: Secondary | ICD-10-CM

## 2015-04-05 DIAGNOSIS — D122 Benign neoplasm of ascending colon: Secondary | ICD-10-CM

## 2015-04-05 DIAGNOSIS — Z1211 Encounter for screening for malignant neoplasm of colon: Secondary | ICD-10-CM

## 2015-04-05 DIAGNOSIS — D125 Benign neoplasm of sigmoid colon: Secondary | ICD-10-CM

## 2015-04-05 DIAGNOSIS — D123 Benign neoplasm of transverse colon: Secondary | ICD-10-CM

## 2015-04-05 MED ORDER — SODIUM CHLORIDE 0.9 % IV SOLN: 500.0000 mL | INTRAVENOUS | Status: DC

## 2015-04-05 NOTE — Progress Notes (Signed)
Called to room to assist during endoscopic procedure.  Patient ID and intended procedure confirmed with present staff. Received instructions for my participation in the procedure from the performing physician.  

## 2015-04-05 NOTE — Progress Notes (Signed)
  [facility] Post-op Note  Patient: Katrina Valdez  Procedure(s) Performed: colonoscopy  Patient Location: [facility] - Recovery Area  Anesthesia Type: Deep Sedation/Propofol  Level of Consciousness: awake, oriented and patient cooperative  Airway and Oxygen Therapy: Patient Spontanous Breathing  Post-op Pain: none  Post-op Assessment:  Post-op Vital signs reviewed, Patient's Cardiovascular Status Stable, Respiratory Function Stable, Patent Airway, No signs of Nausea or vomiting and Pain level controlled  Post-op Vital Signs: Reviewed and stable  Complications: No apparent anesthesia complications  Lolly Glaus E 11:05 AM

## 2015-04-05 NOTE — Op Note (Signed)
[place]  [address] [place], [id number]   COLONOSCOPY PROCEDURE REPORT  PATIENT: Katrina Valdez, Katrina Valdez  MR#: [id number] BIRTHDATE: 25-Jan-1956 , 59  yrs. old GENDER: female ENDOSCOPIST: Jerene Bears, MD REFERRED HU:TMLYYT Everlene Farrier, M.D. PROCEDURE DATE:  04/05/2015 PROCEDURE:   Colonoscopy, screening, Colonoscopy with cold biopsy polypectomy, and Colonoscopy with snare polypectomy First Screening Colonoscopy - Avg.  risk and is 50 yrs.  old or older Yes.  Prior Negative Screening - Now for repeat screening. N/A  History of Adenoma - Now for follow-up colonoscopy & has been > or = to 3 yrs.  N/A  Polyps removed today? Yes ASA CLASS:   Class II INDICATIONS:Screening for colonic neoplasia and Colorectal Neoplasm Risk Assessment for this procedure is average risk. MEDICATIONS: Monitored anesthesia care and Propofol 300 mg IV  DESCRIPTION OF PROCEDURE:   After the risks benefits and alternatives of the procedure were thoroughly explained, informed consent was obtained.  The digital rectal exam revealed no rectal mass.   The LB [id number]-H190 [id number]  endoscope was introduced through the anus and advanced to the cecum, which was identified by both the appendix and ileocecal valve. No adverse events experienced. The quality of the prep was excellent.  (Suprep was used)  The instrument was then slowly withdrawn as the colon was fully examined. Estimated blood loss is zero unless otherwise noted in this procedure report.  COLON FINDINGS: Four sessile polyps ranging between 3-63mm in size were found in the ascending colon, at the cecum, in the sigmoid colon, and at the splenic flexure.  Polypectomies were performed with cold forceps.  The resection was complete, the polyp tissue was completely retrieved and sent to histology.   A sessile polyp measuring 6 mm in size was found in the sigmoid colon.  A polypectomy was performed with a cold snare.  The resection was complete, the  polyp tissue was completely retrieved and sent to histology.   The examination was otherwise normal.  Retroflexed views revealed internal hemorrhoids. The time to cecum = 4.3 Withdrawal time = 12.3   The scope was withdrawn and the procedure completed.  COMPLICATIONS: There were no immediate complications.     ENDOSCOPIC IMPRESSION: 1.   Four sessile polyps ranging between 3-51mm in size were found in the ascending colon, at the cecum, in the sigmoid colon, and at the splenic flexure; polypectomies were performed with cold forceps 2.   Sessile polyp was found in the sigmoid colon; polypectomy was performed with a cold snare 3.   The examination was otherwise normal  RECOMMENDATIONS: 1.  Await pathology results 2.  Timing of repeat colonoscopy will be determined by pathology findings. 3.  You will receive a letter within 1-2 weeks with the results of your biopsy as well as final recommendations.  Please call my office if you have not received a letter after 3 weeks.  eSigned:  Jerene Bears, MD 04/05/2015 11:04 AM   cc: [name], MD and The Patient   PATIENT NAME:  Katrina Valdez, Katrina Valdez MR#: [id number]

## 2015-04-05 NOTE — Patient Instructions (Signed)
YOU HAD AN ENDOSCOPIC PROCEDURE TODAY AT [facility]:   Refer to the procedure report that was given to you for any specific questions about what was found during the examination.  If the procedure report does not answer your questions, please call your gastroenterologist to clarify.  If you requested that your care partner not be given the details of your procedure findings, then the procedure report has been included in a sealed envelope for you to review at your convenience later.  YOU SHOULD EXPECT: Some feelings of bloating in the abdomen. Passage of more gas than usual.  Walking can help get rid of the air that was put into your GI tract during the procedure and reduce the bloating. If you had a lower endoscopy (such as a colonoscopy or flexible sigmoidoscopy) you may notice spotting of blood in your stool or on the toilet paper. If you underwent a bowel prep for your procedure, you may not have a normal bowel movement for a few days.  Please Note:  You might notice some irritation and congestion in your nose or some drainage.  This is from the oxygen used during your procedure.  There is no need for concern and it should clear up in a day or so.  SYMPTOMS TO REPORT IMMEDIATELY:   Following lower endoscopy (colonoscopy or flexible sigmoidoscopy):  Excessive amounts of blood in the stool  Significant tenderness or worsening of abdominal pains  Swelling of the abdomen that is new, acute  Fever of 100F or higher   For urgent or emergent issues, a gastroenterologist can be reached at any hour by calling [phone].   DIET: Your first meal following the procedure should be a small meal and then it is ok to progress to your normal diet. Heavy or fried foods are harder to digest and may make you feel nauseous or bloated.  Likewise, meals heavy in dairy and vegetables can increase bloating.  Drink plenty of fluids but you should avoid alcoholic beverages for 24  hours.  ACTIVITY:  You should plan to take it easy for the rest of today and you should NOT DRIVE or use heavy machinery until tomorrow (because of the sedation medicines used during the test).    FOLLOW UP: Our staff will call the number listed on your records the next business day following your procedure to check on you and address any questions or concerns that you may have regarding the information given to you following your procedure. If we do not reach you, we will leave a message.  However, if you are feeling well and you are not experiencing any problems, there is no need to return our call.  We will assume that you have returned to your regular daily activities without incident.  If any biopsies were taken you will be contacted by phone or by letter within the next 1-3 weeks.  Please call us at [phone] if you have not heard about the biopsies in 3 weeks.    SIGNATURES/CONFIDENTIALITY: You and/or your care partner have signed paperwork which will be entered into your electronic medical record.  These signatures attest to the fact that that the information above on your After Visit Summary has been reviewed and is understood.  Full responsibility of the confidentiality of this discharge information lies with you and/or your care-partner.  Next colonoscopy determined by pathology results. Please review polyp handout provided.

## 2015-04-06 ENCOUNTER — Telehealth: Payer: Self-pay | Admitting: *Deleted

## 2015-04-06 ENCOUNTER — Telehealth: Payer: Self-pay

## 2015-04-06 ENCOUNTER — Other Ambulatory Visit: Payer: Self-pay | Admitting: Physician Assistant

## 2015-04-06 DIAGNOSIS — F988 Other specified behavioral and emotional disorders with onset usually occurring in childhood and adolescence: Secondary | ICD-10-CM

## 2015-04-06 MED ORDER — AMPHETAMINE-DEXTROAMPHETAMINE 20 MG PO TABS: ORAL_TABLET | ORAL | Status: DC

## 2015-04-06 NOTE — Telephone Encounter (Signed)
Done.  Please make patient aware.  Philis Fendt, MS, PA-C   12:13 PM, 04/06/2015

## 2015-04-06 NOTE — Telephone Encounter (Signed)
Pt is needing a refill on her adderall   Best number [phone]

## 2015-04-06 NOTE — Telephone Encounter (Signed)
  Follow up Call-  Call back number 04/05/2015  Post procedure Call Back phone  # [phone]  Permission to leave phone message Yes     Patient questions:  Do you have a fever, pain , or abdominal swelling? No. Pain Score  0 *  Have you tolerated food without any problems? Yes.    Have you been able to return to your normal activities? Yes.    Do you have any questions about your discharge instructions: Diet   No. Medications  No. Follow up visit  No.  Do you have questions or concerns about your Care? No.  Actions: * If pain score is 4 or above: No action needed, pain <4.

## 2015-04-07 NOTE — Telephone Encounter (Signed)
In pick up draw. 

## 2015-04-07 NOTE — Telephone Encounter (Signed)
Pt.notified

## 2015-04-08 ENCOUNTER — Encounter: Payer: Self-pay | Admitting: Internal Medicine

## 2015-04-09 ENCOUNTER — Ambulatory Visit (INDEPENDENT_AMBULATORY_CARE_PROVIDER_SITE_OTHER): Payer: BC Managed Care – PPO

## 2015-04-09 DIAGNOSIS — Z23 Encounter for immunization: Secondary | ICD-10-CM

## 2015-05-25 ENCOUNTER — Telehealth: Payer: Self-pay

## 2015-05-25 ENCOUNTER — Other Ambulatory Visit: Payer: Self-pay | Admitting: Emergency Medicine

## 2015-05-25 DIAGNOSIS — F988 Other specified behavioral and emotional disorders with onset usually occurring in childhood and adolescence: Secondary | ICD-10-CM

## 2015-05-25 MED ORDER — AMPHETAMINE-DEXTROAMPHETAMINE 20 MG PO TABS: ORAL_TABLET | ORAL | Status: DC

## 2015-05-25 NOTE — Telephone Encounter (Signed)
Rx in pick up draw. 

## 2015-05-25 NOTE — Telephone Encounter (Signed)
Prescriptions printed she can pick them up tomorrow

## 2015-05-25 NOTE — Telephone Encounter (Signed)
Pt in need of her ADDERALL 20 MG. Please call [phone] when ready for pick up

## 2015-05-26 NOTE — Telephone Encounter (Signed)
Advised pt

## 2015-06-02 ENCOUNTER — Other Ambulatory Visit: Payer: Self-pay | Admitting: Physician Assistant

## 2015-06-02 NOTE — Telephone Encounter (Signed)
Patient wants to know if she can get a refill for lisinopril. She states that she had an appointment scheduled for 12/22 and had to reschedule due to an emergency. Patient also states that she is out of medication

## 2015-06-03 ENCOUNTER — Encounter: Payer: Self-pay | Admitting: Emergency Medicine

## 2015-07-06 ENCOUNTER — Other Ambulatory Visit: Payer: Self-pay

## 2015-07-06 ENCOUNTER — Ambulatory Visit (INDEPENDENT_AMBULATORY_CARE_PROVIDER_SITE_OTHER): Payer: BC Managed Care – PPO | Admitting: Emergency Medicine

## 2015-07-06 ENCOUNTER — Other Ambulatory Visit: Payer: Self-pay | Admitting: Emergency Medicine

## 2015-07-06 ENCOUNTER — Encounter: Payer: Self-pay | Admitting: Emergency Medicine

## 2015-07-06 VITALS — BP 127/81 | HR 75 | Temp 97.9°F | Resp 16 | Ht 62.2 in | Wt 140.0 lb

## 2015-07-06 DIAGNOSIS — Z23 Encounter for immunization: Secondary | ICD-10-CM | POA: Diagnosis not present

## 2015-07-06 DIAGNOSIS — F909 Attention-deficit hyperactivity disorder, unspecified type: Secondary | ICD-10-CM | POA: Diagnosis not present

## 2015-07-06 DIAGNOSIS — I10 Essential (primary) hypertension: Secondary | ICD-10-CM | POA: Diagnosis not present

## 2015-07-06 DIAGNOSIS — Z1231 Encounter for screening mammogram for malignant neoplasm of breast: Secondary | ICD-10-CM

## 2015-07-06 DIAGNOSIS — E785 Hyperlipidemia, unspecified: Secondary | ICD-10-CM

## 2015-07-06 DIAGNOSIS — F988 Other specified behavioral and emotional disorders with onset usually occurring in childhood and adolescence: Secondary | ICD-10-CM

## 2015-07-06 LAB — LIPID PANEL
CHOL/HDL RATIO: 2.5 ratio (ref <=5.0)
Cholesterol: 206 mg/dL — ABNORMAL HIGH (ref 125–200)
HDL: 82 mg/dL (ref >=46)
LDL Cholesterol: 99 mg/dL (ref <130)
Triglycerides: 123 mg/dL (ref <150)
VLDL: 25 mg/dL (ref <30)

## 2015-07-06 LAB — POCT CBC
Granulocyte percent: 61.5 %G (ref 37–80)
HCT, POC: 45.2 % (ref 37.7–47.9)
Hemoglobin: 15.7 g/dL (ref 12.2–16.2)
LYMPH, POC: 2.6 (ref 0.6–3.4)
MCH, POC: 30.1 pg (ref 27–31.2)
MCHC: 34.8 g/dL (ref 31.8–35.4)
MCV: 86.5 fL (ref 80–97)
MID (cbc): 0.2 (ref 0–0.9)
MPV: 6.8 fL (ref 0–99.8)
PLATELET COUNT, POC: 294 10*3/uL (ref 142–424)
POC Granulocyte: 4.4 (ref 2–6.9)
POC LYMPH %: 36 % (ref 10–50)
POC MID %: 2.5 %M (ref 0–12)
RBC: 5.23 M/uL (ref 4.04–5.48)
RDW, POC: 13.3 %
WBC: 7.2 10*3/uL (ref 4.6–10.2)

## 2015-07-06 LAB — COMPLETE METABOLIC PANEL WITH GFR
ALBUMIN: 4.8 g/dL (ref 3.6–5.1)
ALK PHOS: 73 U/L (ref 33–130)
ALT: 28 U/L (ref 6–29)
AST: 27 U/L (ref 10–35)
BILIRUBIN TOTAL: 1.6 mg/dL — AB (ref 0.2–1.2)
BUN: 16 mg/dL (ref 7–25)
CO2: 29 mmol/L (ref 20–31)
Calcium: 9.8 mg/dL (ref 8.6–10.4)
Chloride: 101 mmol/L (ref 98–110)
Creat: 0.7 mg/dL (ref 0.50–0.99)
GFR, Est African American: >89 mL/min (ref >=60)
GFR, Est Non African American: >89 mL/min (ref >=60)
GLUCOSE: 102 mg/dL — AB (ref 65–99)
Potassium: 4.7 mmol/L (ref 3.5–5.3)
SODIUM: 138 mmol/L (ref 135–146)
TOTAL PROTEIN: 7.4 g/dL (ref 6.1–8.1)

## 2015-07-06 MED ORDER — AMPHETAMINE-DEXTROAMPHETAMINE 20 MG PO TABS: ORAL_TABLET | ORAL | Status: DC

## 2015-07-06 NOTE — Progress Notes (Signed)
Patient ID: Katrina Valdez, female   DOB: [birth year], 60 y.o.   MRN: [id number]     By signing my name below, I, Zola Button, attest that this documentation has been prepared under the direction and in the presence of [name], MD.  Electronically Signed: Zola Button, Medical Scribe. 07/06/2015. 11:30 AM.   Chief Complaint:  Chief Complaint  Patient presents with  . Follow-up  . Hypertension  . Medication Refill    HPI: Katrina Valdez is a 60 y.o. female with a history of sternum fracture (2014) and ADHD who reports to [facility] today for a medication refill and follow-up for hypertension. Her last visit to the cardiologist was about 3 years ago. She had a stress test done at that time. She was not told to follow-up. FMHx includes CAD (father, mother), MI (father at age 83, mother stress-induced), and A Fib.  Patient notes her sternum has been doing fine. She has been exercising in the gym [facility]) without any problems. She avoids certain exercises.  She takes Adderall 10 mg about 4 times a week on average.   She is set up for a mammogram. She also has an appointment for a pap smear.   Patient believes she has had the Tdap, but she is unsure. Our records show that she has not had the Tdap. She does have two grandchildren.  Past Medical History  Diagnosis Date  . Hypertension   . Fibromyalgia 1997  . ADHD (attention deficit hyperactivity disorder)   . Allergy   . Anxiety   . Hyperlipidemia    Past Surgical History  Procedure Laterality Date  . Tubal ligation    . Hand surgery  2011  . Tonsills remov  1973  . Sepsis    . Bunionectomy    . Uterine ablation    . Sternum  2014    fracture   Social History   Social History  . Marital Status: Married    Spouse Name: N/A  . Number of Children: N/A  . Years of Education: N/A   Occupational History  . retired    Social History Main Topics  . Smoking status: Former Smoker    Quit date: 11/22/1992  .  Smokeless tobacco: Never Used  . Alcohol Use: 8.4 oz/week    0 Standard drinks or equivalent, 14 Glasses of wine per week     Comment: daily 2 drinks  . Drug Use: No  . Sexual Activity: Yes    Birth Control/ Protection: None     Comment: number of sex partners in the last 46 months 1   Other Topics Concern  . None   Social History Narrative   Marital: married x 33 years; happily married.      Children:  2 children; 2 grandchildren.      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      Exercise:  [place] 4 days per week   Family History  Problem Relation Age of Onset  . Stroke Mother 67    hemorrhagic CVA x 2  . Hypertension Mother   . Heart disease Mother 65    AMI  . Hyperlipidemia Son   . Cancer Paternal Aunt   . Heart disease Father   . Heart disease Daughter     monitoring cardiac  . Colon cancer Neg Hx    Allergies  Allergen  Reactions  . Codeine Nausea And Vomiting  . Penicillins Swelling  . Sulfa Antibiotics Nausea Only   Prior to Admission medications   Medication Sig Start Date End Date Taking? Authorizing Provider  ALPRAZolam (XANAX) 0.5 MG tablet TAKE 1/2 TO 1 TABLET BY MOUTH 2 TIMES A DAY AS NEEDED FOR ANXIETY. 12/05/14  Yes Darlyne Russian, MD  amphetamine-dextroamphetamine (ADDERALL) 20 MG tablet Take 1 tablet twice a day 05/25/15  Yes Darlyne Russian, MD  Flaxseed, Linseed, (FLAX SEED OIL) 1000 MG CAPS Take 1,000 mg by mouth daily.    Yes Historical Provider, MD  fluticasone (FLONASE) 50 MCG/ACT nasal spray Place 2 sprays into both nostrils daily as needed for rhinitis or allergies. 06/29/14  Yes Bennett Scrape V, PA-C  ipratropium (ATROVENT) 0.03 % nasal spray Place 2 sprays into the nose 2 (two) times daily as needed for rhinitis. 06/29/14  Yes Nicole Bush V, PA-C  KLS ALLER-TEC 10 MG tablet TAKE 1 TABLET BY MOUTH DAILY.   Yes Darlyne Russian, MD    lisinopril-hydrochlorothiazide (PRINZIDE,ZESTORETIC) 10-12.5 MG tablet TAKE 1 TABLET BY MOUTH DAILY. 06/03/15  Yes Darlyne Russian, MD  Magnesium 250 MG TABS Take by mouth.   Yes Historical Provider, MD  rosuvastatin (CRESTOR) 10 MG tablet Take 1 tablet (10 mg total) by mouth daily. 01/22/15  Yes Darlyne Russian, MD     ROS: The patient denies fevers, chills, night sweats, unintentional weight loss, chest pain, palpitations, wheezing, dyspnea on exertion, nausea, vomiting, abdominal pain, dysuria, hematuria, melena, numbness, weakness, or tingling.   All other systems have been reviewed and were otherwise negative with the exception of those mentioned in the HPI and as above.    PHYSICAL EXAM: Filed Vitals:   07/06/15 1101  BP: 127/81  Pulse: 75  Temp: 97.9 F (36.6 C)  Resp: 16   Body mass index is 25.44 kg/(m^2).   General: Alert, no acute distress HEENT:  Normocephalic, atraumatic, oropharynx patent. Eye: [facility] Cardiovascular:  Regular rate and rhythm, no rubs murmurs or gallops.  No Carotid bruits, radial pulse intact. No pedal edema.  Respiratory: Clear to auscultation bilaterally.  No wheezes, rales, or rhonchi.  No cyanosis, no use of accessory musculature Abdominal: No organomegaly, abdomen is soft and non-tender, positive bowel sounds.  No masses. Musculoskeletal: Gait intact. No edema, tenderness Skin: No rashes. Neurologic: Facial musculature symmetric. Psychiatric: Patient acts appropriately throughout our interaction. Lymphatic: No cervical or submandibular lymphadenopathy    LABS:    EKG/XRAY:   Primary read interpreted by Dr. Everlene Farrier at [facility].   ASSESSMENT/PLAN: Patient looks good. She has decreased her use of Adderall. She now only takes a half tablet each morning. She does have a strong family history of heart disease along with her history of hyperlipidemia and hypertension. She had evaluation by the cardiologist 2-1/2 years ago. Referral made to Dr  Sallyanne Kuster  for his opinion.I personally performed the services described in this documentation, which was scribed in my presence. The recorded information has been reviewed and is accurate.   Gross sideeffects, risk and benefits, and alternatives of medications d/w patient. Patient is aware that all medications have potential sideeffects and we are unable to predict every sideeffect or drug-drug interaction that may occur.  [name] MD 07/06/2015 11:30 AM

## 2015-07-07 ENCOUNTER — Other Ambulatory Visit: Payer: Self-pay | Admitting: Emergency Medicine

## 2015-07-08 NOTE — Telephone Encounter (Signed)
Faxed

## 2015-07-09 LAB — BILIRUBIN, FRACTIONATED(TOT/DIR/INDIR)
BILIRUBIN DIRECT: 0.3 mg/dL — AB (ref <=0.2)
BILIRUBIN INDIRECT: 1.3 mg/dL — AB (ref 0.2–1.2)
Total Bilirubin: 1.6 mg/dL — ABNORMAL HIGH (ref 0.2–1.2)

## 2015-07-16 ENCOUNTER — Ambulatory Visit
Admission: RE | Admit: 2015-07-16 | Discharge: 2015-07-16 | Disposition: A | Discharge status: Home / Self Care | Payer: BC Managed Care – PPO | Source: Ambulatory Visit

## 2015-07-16 DIAGNOSIS — Z1231 Encounter for screening mammogram for malignant neoplasm of breast: Secondary | ICD-10-CM

## 2015-07-20 ENCOUNTER — Other Ambulatory Visit: Payer: Self-pay | Admitting: Obstetrics and Gynecology

## 2015-07-20 DIAGNOSIS — N951 Menopausal and female climacteric states: Secondary | ICD-10-CM

## 2015-07-30 ENCOUNTER — Telehealth: Payer: Self-pay

## 2015-07-30 NOTE — Telephone Encounter (Signed)
Pt is needing to talk with someone since her and her husband has been exposed by the grandchilren with the flu-can tamaflu be called in for both  Best [phone]   Husband dob is 01/13/42 thomas Iser

## 2015-07-30 NOTE — Telephone Encounter (Signed)
Left message for patient. Recommended patient come in for full evaluation of any symptoms that she may be having. I provided her with the clinic hours for Saturday and Sunday.

## 2015-08-09 ENCOUNTER — Other Ambulatory Visit: Payer: Self-pay | Admitting: Emergency Medicine

## 2015-08-09 ENCOUNTER — Ambulatory Visit: Payer: BC Managed Care – PPO | Admitting: Cardiovascular Disease

## 2015-08-10 ENCOUNTER — Other Ambulatory Visit: Payer: Self-pay | Admitting: Obstetrics and Gynecology

## 2015-08-10 DIAGNOSIS — N951 Menopausal and female climacteric states: Secondary | ICD-10-CM

## 2015-08-11 ENCOUNTER — Ambulatory Visit
Admission: RE | Admit: 2015-08-11 | Discharge: 2015-08-11 | Disposition: A | Discharge status: Home / Self Care | Payer: BC Managed Care – PPO | Source: Ambulatory Visit | Attending: Obstetrics and Gynecology | Admitting: Obstetrics and Gynecology

## 2015-08-11 DIAGNOSIS — N951 Menopausal and female climacteric states: Secondary | ICD-10-CM

## 2015-08-14 ENCOUNTER — Ambulatory Visit (INDEPENDENT_AMBULATORY_CARE_PROVIDER_SITE_OTHER): Payer: BC Managed Care – PPO | Admitting: Family Medicine

## 2015-08-14 VITALS — BP 116/74 | HR 62 | Temp 98.1°F | Resp 18 | Wt 143.2 lb

## 2015-08-14 DIAGNOSIS — R05 Cough: Secondary | ICD-10-CM

## 2015-08-14 DIAGNOSIS — R6889 Other general symptoms and signs: Secondary | ICD-10-CM

## 2015-08-14 DIAGNOSIS — J029 Acute pharyngitis, unspecified: Secondary | ICD-10-CM

## 2015-08-14 DIAGNOSIS — R059 Cough, unspecified: Secondary | ICD-10-CM

## 2015-08-14 DIAGNOSIS — J302 Other seasonal allergic rhinitis: Secondary | ICD-10-CM | POA: Diagnosis not present

## 2015-08-14 DIAGNOSIS — J309 Allergic rhinitis, unspecified: Secondary | ICD-10-CM | POA: Diagnosis not present

## 2015-08-14 LAB — POCT INFLUENZA A/B
INFLUENZA A, POC: NEGATIVE
Influenza B, POC: NEGATIVE

## 2015-08-14 LAB — POCT RAPID STREP A (OFFICE): RAPID STREP A SCREEN: NEGATIVE

## 2015-08-14 MED ORDER — HYDROCOD POLST-CPM POLST ER 10-8 MG/5ML PO SUER: 5.0000 mL | Freq: Two times a day (BID) | ORAL | Status: DC | PRN

## 2015-08-14 MED ORDER — FLUTICASONE PROPIONATE 50 MCG/ACT NA SUSP: 2.0000 | Freq: Every day | NASAL | Status: DC | PRN

## 2015-08-14 NOTE — Progress Notes (Signed)
Patient ID: Katrina Valdez, female    DOB: 09/19/1955  Age: 60 y.o. MRN: [id number]  Chief Complaint  Patient presents with  . URI    sore throat, sneezing, cough. on and off for 2 years    Subjective:   60 year old patient of Dr. Josepha Pigg who is here with an illness over the last couple of weeks. She has allergy symptoms for a long time. However the last couple weeks she's had exposure to the flu with 2 grandchildren indicting diagnosed with it. She did have her flu vaccination this year. She has had some fevers, up to 100. She has had head congestion and some cough. Blowing a lot out of her nose. She does not smoke. She has taken some old prescription of benzonatate and Tussionex cough syrup which seemed help her some. She has felt stuffy and her ears. Current allergies, medications, problem list, past/family and social histories reviewed.  Objective:  BP 116/74 mmHg  Pulse 62  Temp(Src) 98.1 F (36.7 C) (Oral)  Resp 18  Wt 143 lb 3.2 oz (64.955 kg)  SpO2 99%  No major acute distress. Sniffling a lot. TMs are normal. Throat has erythema especially on the right. Neck supple without major nodes. Chest is clear to auscultation. Heart regular without murmurs.  Assessment & Plan:   Assessment: 1. Flu-like symptoms   2. Sore throat   3. Cough   4. Other seasonal allergic rhinitis   5. Allergic rhinitis, unspecified allergic rhinitis type       Plan:   Orders Placed This Encounter  Procedures  . POCT Influenza A/B  . POCT rapid strep A    Meds ordered this encounter  Medications  . fluticasone (FLONASE) 50 MCG/ACT nasal spray    Sig: Place 2 sprays into both nostrils daily as needed for rhinitis or allergies.    Dispense:  16 g    Refill:  12  . chlorpheniramine-HYDROcodone (TUSSIONEX PENNKINETIC ER) 10-8 MG/5ML SUER    Sig: Take 5 mLs by mouth every 12 (twelve) hours as needed for cough.    Dispense:  115 mL    Refill:  0   Results for orders placed or performed in  visit on 08/14/15  POCT Influenza A/B  Result Value Ref Range   Influenza A, POC Negative Negative   Influenza B, POC Negative Negative  POCT rapid strep A  Result Value Ref Range   Rapid Strep A Screen Negative Negative         Patient Instructions  Drink plenty of fluids  Use the fluticasone spray 2 sprays each nostril twice daily for about 4- 5 days, then decrease to once daily  Take over-the-counter Claritin (loratadine) or Allegra (fexofenadine) 1 daily  Return if worse or not improving  Use the Tussionex cough syrup 1 teaspoon twice daily if needed     Return if symptoms worsen or fail to improve.   HOPPER,DAVID, MD 08/14/2015

## 2015-08-14 NOTE — Patient Instructions (Addendum)
Drink plenty of fluids  Use the fluticasone spray 2 sprays each nostril twice daily for about 4- 5 days, then decrease to once daily  Take over-the-counter Claritin (loratadine) or Allegra (fexofenadine) 1 daily  Return if worse or not improving  Use the Tussionex cough syrup 1 teaspoon twice daily if needed

## 2015-09-24 ENCOUNTER — Other Ambulatory Visit: Payer: Self-pay | Admitting: Emergency Medicine

## 2015-09-27 ENCOUNTER — Telehealth: Payer: Self-pay

## 2015-09-27 MED ORDER — LISINOPRIL-HYDROCHLOROTHIAZIDE 10-12.5 MG PO TABS: 1.0000 | ORAL_TABLET | Freq: Every day | ORAL | Status: DC

## 2015-09-27 NOTE — Telephone Encounter (Signed)
Done

## 2015-09-27 NOTE — Telephone Encounter (Signed)
Pt is completely out of her blood pressure medication and is checking on status of refill request sent in from pharmacy on Friday  Best number [phone]

## 2015-11-09 ENCOUNTER — Other Ambulatory Visit: Payer: Self-pay | Admitting: Physician Assistant

## 2015-11-15 ENCOUNTER — Telehealth: Payer: Self-pay

## 2015-11-15 NOTE — Telephone Encounter (Signed)
Pt is needing a refill on adderall   Best number to call [phone]

## 2015-11-16 ENCOUNTER — Other Ambulatory Visit: Payer: Self-pay | Admitting: Emergency Medicine

## 2015-11-16 DIAGNOSIS — F988 Other specified behavioral and emotional disorders with onset usually occurring in childhood and adolescence: Secondary | ICD-10-CM

## 2015-11-16 MED ORDER — AMPHETAMINE-DEXTROAMPHETAMINE 20 MG PO TABS: ORAL_TABLET | ORAL | Status: DC

## 2015-11-16 NOTE — Telephone Encounter (Signed)
Notified pt ready on VM.

## 2015-11-16 NOTE — Telephone Encounter (Signed)
Call patient prescriptions were written

## 2015-12-04 ENCOUNTER — Other Ambulatory Visit: Payer: Self-pay | Admitting: Emergency Medicine

## 2016-03-06 ENCOUNTER — Ambulatory Visit (INDEPENDENT_AMBULATORY_CARE_PROVIDER_SITE_OTHER): Payer: BC Managed Care – PPO | Admitting: Emergency Medicine

## 2016-03-06 VITALS — BP 122/72 | HR 79 | Temp 97.9°F | Resp 17 | Ht 62.5 in | Wt 141.0 lb

## 2016-03-06 DIAGNOSIS — I10 Essential (primary) hypertension: Secondary | ICD-10-CM

## 2016-03-06 DIAGNOSIS — Z23 Encounter for immunization: Secondary | ICD-10-CM | POA: Diagnosis not present

## 2016-03-06 DIAGNOSIS — E785 Hyperlipidemia, unspecified: Secondary | ICD-10-CM | POA: Diagnosis not present

## 2016-03-06 DIAGNOSIS — F909 Attention-deficit hyperactivity disorder, unspecified type: Secondary | ICD-10-CM | POA: Diagnosis not present

## 2016-03-06 DIAGNOSIS — F988 Other specified behavioral and emotional disorders with onset usually occurring in childhood and adolescence: Secondary | ICD-10-CM

## 2016-03-06 LAB — LIPID PANEL
CHOL/HDL RATIO: 2.3 ratio (ref <=5.0)
Cholesterol: 176 mg/dL (ref 125–200)
HDL: 78 mg/dL (ref >=46)
LDL CALC: 76 mg/dL (ref <130)
Triglycerides: 109 mg/dL (ref <150)
VLDL: 22 mg/dL (ref <30)

## 2016-03-06 LAB — TSH: TSH: 0.64 m[IU]/L

## 2016-03-06 LAB — BASIC METABOLIC PANEL WITH GFR
BUN: 14 mg/dL (ref 7–25)
CHLORIDE: 105 mmol/L (ref 98–110)
CO2: 27 mmol/L (ref 20–31)
Calcium: 9.7 mg/dL (ref 8.6–10.4)
Creat: 0.73 mg/dL (ref 0.50–0.99)
GFR, Est African American: >89 mL/min (ref >=60)
GFR, Est Non African American: 89 mL/min (ref >=60)
GLUCOSE: 103 mg/dL — AB (ref 65–99)
POTASSIUM: 4.4 mmol/L (ref 3.5–5.3)
Sodium: 141 mmol/L (ref 135–146)

## 2016-03-06 LAB — POCT CBC
Granulocyte percent: 56.7 %G (ref 37–80)
HEMATOCRIT: 43.3 % (ref 37.7–47.9)
Hemoglobin: 15 g/dL (ref 12.2–16.2)
LYMPH, POC: 2.2 (ref 0.6–3.4)
MCH: 29.6 pg (ref 27–31.2)
MCHC: 34.6 g/dL (ref 31.8–35.4)
MCV: 85.4 fL (ref 80–97)
MID (CBC): 0.6 (ref 0–0.9)
MPV: 7.1 fL (ref 0–99.8)
POC GRANULOCYTE: 3.7 (ref 2–6.9)
POC LYMPH PERCENT: 33.7 %L (ref 10–50)
POC MID %: 9.6 % (ref 0–12)
Platelet Count, POC: 240 10*3/uL (ref 142–424)
RBC: 5.07 M/uL (ref 4.04–5.48)
RDW, POC: 13.4 %
WBC: 6.6 10*3/uL (ref 4.6–10.2)

## 2016-03-06 MED ORDER — ALPRAZOLAM 0.5 MG PO TABS
ORAL_TABLET | ORAL | 0 refills | Status: DC
Start: 2016-03-06 — End: 2016-07-21

## 2016-03-06 MED ORDER — AMPHETAMINE-DEXTROAMPHETAMINE 20 MG PO TABS: ORAL_TABLET | ORAL | 0 refills | Status: DC

## 2016-03-06 MED ORDER — LISINOPRIL-HYDROCHLOROTHIAZIDE 10-12.5 MG PO TABS: 1.0000 | ORAL_TABLET | Freq: Every day | ORAL | 3 refills | Status: DC

## 2016-03-06 NOTE — Progress Notes (Addendum)
By signing my name below, I, Mesha Guinyard, attest that this documentation has been prepared under the direction and in the presence of [name], MD.  Electronically Signed: Verlee Monte, Medical Scribe. 03/06/16. 9:15 AM.  Chief Complaint:  Chief Complaint  Patient presents with  . Follow-up    HPI: Katrina Valdez is a 60 y.o. female who reports to [facility] today for ADD follow-up. Pt states she's doing good. Pt has cut back her ADD medications to PRN and states she rarely takes it now. Pt takes Xanax 2-3x a week and Adderall 2-3x a week.  Immunization: Pt would like to get her flu shot and would like to get her "senior" shot since she has young grandkids. Immunization History  Administered Date(s) Administered  . Influenza Split 04/28/2012  . Influenza,inj,Quad PF,36+ Mos 04/05/2013, 06/29/2014, 04/09/2015  . Tdap 07/06/2015  . Zoster 04/07/2015   Sternum: Pt feels pressure in her sternum when she does burpees in the gym, but otherwise her sternum is doing good.  Cancer Screening: Pt is UTD on her mammogram and colonoscopy.  Referral: Pt is going to be followed by a physician that lives close to her.  Past Medical History:  Diagnosis Date  . ADHD (attention deficit hyperactivity disorder)   . Allergy   . Anxiety   . Fibromyalgia 1997  . Hyperlipidemia   . Hypertension    Past Surgical History:  Procedure Laterality Date  . BUNIONECTOMY    . HAND SURGERY  2011  . sepsis    . STERNUM  2014   fracture  . tonsills remov  1973  . TUBAL LIGATION    . uterine ablation     Social History   Social History  . Marital status: Married    Spouse name: N/A  . Number of children: N/A  . Years of education: N/A   Occupational History  . retired    Social History Main Topics  . Smoking status: Former Smoker    Quit date: 11/22/1992  . Smokeless tobacco: Never Used  . Alcohol use 8.4 oz/week    14 Glasses of wine per week     Comment: daily 2 drinks  . Drug  use: No  . Sexual activity: Yes    Birth control/ protection: None     Comment: number of sex partners in the last 45 months 1   Other Topics Concern  . None   Social History Narrative   Marital: married x 33 years; happily married.      Children:  2 children; 2 grandchildren.      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      Exercise:  [place] 4 days per week   Family History  Problem Relation Age of Onset  . Stroke Mother 28    hemorrhagic CVA x 2  . Hypertension Mother   . Heart disease Mother 14    AMI  . Hyperlipidemia Son   . Cancer Paternal Aunt   . Heart disease Father   . Heart disease Daughter     monitoring cardiac  . Colon cancer Neg Hx    Allergies  Allergen Reactions  . Codeine Nausea And Vomiting  . Penicillins Swelling  . Sulfa Antibiotics Nausea Only   Prior to Admission medications   Medication Sig Start Date End Date Taking? Authorizing Provider  ALPRAZolam Duanne Moron) 0.5 MG  tablet TAKE 1/2 TO 1 TABLET BY MOUTH 2 TIMES A DAY AS NEEDED FOR ANXIETY. 07/08/15  Yes Darlyne Russian, MD  amphetamine-dextroamphetamine (ADDERALL) 20 MG tablet Take one half tablet once or twice daily. 11/16/15  Yes Darlyne Russian, MD  chlorpheniramine-HYDROcodone (TUSSIONEX PENNKINETIC ER) 10-8 MG/5ML SUER Take 5 mLs by mouth every 12 (twelve) hours as needed for cough. 08/14/15  Yes Posey Boyer, MD  Flaxseed, Linseed, (FLAX SEED OIL) 1000 MG CAPS Take 1,000 mg by mouth daily.    Yes Historical Provider, MD  fluticasone (FLONASE) 50 MCG/ACT nasal spray Place 2 sprays into both nostrils daily as needed for rhinitis or allergies. 08/14/15  Yes Posey Boyer, MD  ipratropium (ATROVENT) 0.03 % nasal spray Place 2 sprays into the nose 2 (two) times daily as needed for rhinitis. 06/29/14  Yes Nicole V Bush, PA-C  KLS ALLER-TEC 10 MG tablet TAKE 1 TABLET BY MOUTH DAILY.   Yes Darlyne Russian, MD    lisinopril-hydrochlorothiazide (PRINZIDE,ZESTORETIC) 10-12.5 MG tablet TAKE 1 TABLET BY MOUTH DAILY. 12/05/15  Yes Chelle Jeffery, PA-C  Magnesium 250 MG TABS Take by mouth.   Yes Historical Provider, MD  rosuvastatin (CRESTOR) 10 MG tablet TAKE 1 TABLET (10 MG TOTAL) BY MOUTH DAILY. 08/11/15  Yes Darlyne Russian, MD     ROS: The patient denies fevers, chills, night sweats, unintentional weight loss, chest pain, palpitations, wheezing, dyspnea on exertion, nausea, vomiting, abdominal pain, dysuria, hematuria, melena, numbness, weakness, or tingling.  All other systems have been reviewed and were otherwise negative with the exception of those mentioned in the HPI and as above.    PHYSICAL EXAM: Vitals:   03/06/16 0857  BP: 122/72  Pulse: 79  Resp: 17  Temp: 97.9 F (36.6 C)   Body mass index is 25.38 kg/m.   General: Alert, no acute distress HEENT:  Normocephalic, atraumatic, oropharynx patent. Eye: Juliette Mangle [facility] Cardiovascular:  Regular rate and rhythm, no rubs murmurs or gallops.  No Carotid bruits, radial pulse intact. No pedal edema.  Respiratory: Clear to auscultation bilaterally.  No wheezes, rales, or rhonchi.  No cyanosis, no use of accessory musculature Abdominal: No organomegaly, abdomen is soft and non-tender, positive bowel sounds.  No masses. Musculoskeletal: Gait intact. No edema, tenderness Skin: No rashes. Neurologic: Facial musculature symmetric. Psychiatric: Patient acts appropriately throughout our interaction. Lymphatic: No cervical or submandibular lymphadenopathy  LABS: Results for orders placed or performed in visit on 03/06/16  POCT CBC  Result Value Ref Range   WBC 6.6 4.6 - 10.2 K/uL   Lymph, poc 2.2 0.6 - 3.4   POC LYMPH PERCENT 33.7 10 - 50 %L   MID (cbc) 0.6 0 - 0.9   POC MID % 9.6 0 - 12 %M   POC Granulocyte 3.7 2 - 6.9   Granulocyte percent 56.7 37 - 80 %G   RBC 5.07 4.04 - 5.48 M/uL   Hemoglobin 15.0 12.2 - 16.2 g/dL   HCT, POC 43.3 37.7 -  47.9 %   MCV 85.4 80 - 97 fL   MCH, POC 29.6 27 - 31.2 pg   MCHC 34.6 31.8 - 35.4 g/dL   RDW, POC 13.4 %   Platelet Count, POC 240 142 - 424 K/uL   MPV 7.1 0 - 99.8 fL   EKG/XRAY:   Primary read interpreted by Dr. Everlene Farrier at [facility].   ASSESSMENT/PLAN: Patient doing great. Flu shot was given. She was given 1 refill on her Adderall and one refill  on her Xanax. She takes these only occasionally. No other changes.I personally performed the services described in this documentation, which was scribed in my presence. The recorded information has been reviewed and is accurate.   Gross sideeffects, risk and benefits, and alternatives of medications d/w patient. Patient is aware that all medications have potential sideeffects and we are unable to predict every sideeffect or drug-drug interaction that may occur.  [name] MD 03/06/2016 9:15 AM

## 2016-03-06 NOTE — Patient Instructions (Signed)
     IF you received an x-ray today, you will receive an invoice from [facility]. Please contact [facility] at [phone] with questions or concerns regarding your invoice.   IF you received labwork today, you will receive an invoice from [facility]. Please contact [facility] at [phone] with questions or concerns regarding your invoice.   Our billing staff will not be able to assist you with questions regarding bills from these companies.  You will be contacted with the lab results as soon as they are available. The fastest way to get your results is to activate your My Chart account. Instructions are located on the last page of this paperwork. If you have not heard from us regarding the results in 2 weeks, please contact this office.      

## 2016-03-09 ENCOUNTER — Other Ambulatory Visit: Payer: Self-pay | Admitting: Emergency Medicine

## 2016-03-09 MED ORDER — ROSUVASTATIN CALCIUM 10 MG PO TABS: ORAL_TABLET | ORAL | 11 refills | Status: DC

## 2016-04-12 ENCOUNTER — Other Ambulatory Visit: Payer: Self-pay

## 2016-04-12 NOTE — Telephone Encounter (Signed)
Please authorize this prescription on my behalf while I am out of the office.

## 2016-04-12 NOTE — Telephone Encounter (Signed)
Last ADD OV w/ Daub 9/25 and was given a Rx then.

## 2016-04-12 NOTE — Telephone Encounter (Signed)
Katrina Valdez  adderall  refiill

## 2016-04-13 MED ORDER — AMPHETAMINE-DEXTROAMPHETAMINE 20 MG PO TABS: ORAL_TABLET | ORAL | 0 refills | Status: DC

## 2016-04-13 NOTE — Telephone Encounter (Signed)
Done

## 2016-04-19 ENCOUNTER — Telehealth: Payer: Self-pay

## 2016-04-19 NOTE — Telephone Encounter (Signed)
PA completed on covermymeds for adderall. Pending.

## 2016-04-20 NOTE — Telephone Encounter (Signed)
PA approved through 04/20/2019

## 2016-04-20 NOTE — Telephone Encounter (Signed)
PA approved. Notified pharm.

## 2016-05-25 ENCOUNTER — Other Ambulatory Visit: Payer: Self-pay

## 2016-05-25 NOTE — Telephone Encounter (Signed)
Adderall Refill Request   [id number]

## 2016-05-30 NOTE — Telephone Encounter (Signed)
Last OV for adderall 9/25, last RF 11/2. Daub pt.

## 2016-05-31 ENCOUNTER — Other Ambulatory Visit: Payer: Self-pay

## 2016-05-31 NOTE — Telephone Encounter (Signed)
Pt is checking on status of her request of meds refill from 05/25/16

## 2016-06-01 MED ORDER — AMPHETAMINE-DEXTROAMPHETAMINE 20 MG PO TABS: ORAL_TABLET | ORAL | 0 refills | Status: DC

## 2016-06-01 NOTE — Telephone Encounter (Signed)
Up front for pick up pt advised 

## 2016-06-01 NOTE — Telephone Encounter (Signed)
Pt states she called last week for refill and was not addressed.  she has seen chelle, dr Everlene Farrier and Judson Roch in past for this. Last refill 04/2016 Last ov 02/2016.

## 2016-06-01 NOTE — Telephone Encounter (Signed)
Meds ordered this encounter  Medications  . amphetamine-dextroamphetamine (ADDERALL) 20 MG tablet    Sig: Take one half tablet once or twice daily.    Dispense:  30 tablet    Refill:  0

## 2016-07-12 ENCOUNTER — Telehealth: Payer: Self-pay | Admitting: *Deleted

## 2016-07-12 MED ORDER — AMPHETAMINE-DEXTROAMPHETAMINE 20 MG PO TABS: ORAL_TABLET | ORAL | 0 refills | Status: DC

## 2016-07-12 NOTE — Telephone Encounter (Signed)
I explained she needs ov to establish with new pmd since last seen by daub 02/2016, tx up front for an appt.

## 2016-07-12 NOTE — Telephone Encounter (Signed)
Patient called stating she needs her adderall refilled, patient is requesting more than one month refill. Please advise

## 2016-07-12 NOTE — Telephone Encounter (Signed)
Meds ordered this encounter  Medications  . amphetamine-dextroamphetamine (ADDERALL) 20 MG tablet    Sig: Take one half tablet once or twice daily.    Dispense:  30 tablet    Refill:  0

## 2016-07-13 NOTE — Telephone Encounter (Signed)
IC LMOVM rx ready at front desk for pick up.

## 2016-07-21 ENCOUNTER — Encounter: Payer: Self-pay | Admitting: Physician Assistant

## 2016-07-21 ENCOUNTER — Ambulatory Visit (INDEPENDENT_AMBULATORY_CARE_PROVIDER_SITE_OTHER): Payer: BC Managed Care – PPO | Admitting: Physician Assistant

## 2016-07-21 VITALS — BP 118/82 | HR 82 | Temp 97.9°F | Resp 16 | Ht 62.5 in | Wt 147.8 lb

## 2016-07-21 DIAGNOSIS — Z7689 Persons encountering health services in other specified circumstances: Secondary | ICD-10-CM | POA: Diagnosis not present

## 2016-07-21 DIAGNOSIS — I10 Essential (primary) hypertension: Secondary | ICD-10-CM | POA: Diagnosis not present

## 2016-07-21 DIAGNOSIS — F419 Anxiety disorder, unspecified: Secondary | ICD-10-CM

## 2016-07-21 DIAGNOSIS — F909 Attention-deficit hyperactivity disorder, unspecified type: Secondary | ICD-10-CM | POA: Diagnosis not present

## 2016-07-21 MED ORDER — AMPHETAMINE-DEXTROAMPHETAMINE 20 MG PO TABS: ORAL_TABLET | ORAL | 0 refills | Status: DC

## 2016-07-21 MED ORDER — ALPRAZOLAM 0.5 MG PO TABS: ORAL_TABLET | ORAL | 0 refills | Status: DC

## 2016-07-21 NOTE — Progress Notes (Signed)
Patient ID: Katrina Valdez, female    DOB: 07-19-1955, 61 y.o.   MRN: [id number]  PCP: Harrison Mons, PA-C  Chief Complaint  Patient presents with  . Medication Refill    xanax, adderall  . Re-establish Care    Subjective:   Presents for medication refill and to establish care.  Pt is a 61 yo Caucasian female who presents to establish care and for refill of Alprazolam and Adderall. Pt states that she takes both medications as directed, usually PRN. Pt states that, with the medication, her anxiety and inattention symptoms are well-controlled.   Denies loss of appetite, difficulty sleeping, weight loss, cough, SOB, chest pain, palpitations, abdominal pain, nausea, vomiting, diarrhea, or constipation. She continues to be very active, attending cycle and pilates classes, and watches her grandchild 2-3 times per week.  Pt requests that she received pre-dated scripts so that she does not have to come to the clinic every month. Pt has no other concerns or questions at this time.  Review of Systems In addition to that stated in HPI above: Const: Denies fever, chills, or fatigue.    Patient Active Problem List   Diagnosis Date Noted  . Allergic rhinitis 06/29/2014  . Multiple rib fractures 12/19/2013  . Dysphagia, unspecified(787.20) 11/12/2013  . Fracture, sternum closed 04/05/2013  . Mixed hyperlipidemia 11/22/2012  . HTN (hypertension) 04/29/2012  . Essential hypertension, benign 04/28/2012  . Need for prophylactic vaccination and inoculation against influenza 04/28/2012  . ADHD (attention deficit hyperactivity disorder) 04/28/2012  . Anxiety 04/28/2012     Prior to Admission medications   Medication Sig Start Date End Date Taking? Authorizing Provider  ALPRAZolam (XANAX) 0.5 MG tablet TAKE 1/2 TO 1 TABLET BY MOUTH 2 TIMES A DAY AS NEEDED FOR ANXIETY. 07/21/16  Yes Katrina Jeffery, PA-C  ALPRAZolam (XANAX) 0.5 MG tablet TAKE 1/2 TO 1 TABLET BY MOUTH 2 TIMES A DAY AS  NEEDED FOR ANXIETY. 07/21/16  Yes Katrina Jeffery, PA-C  amphetamine-dextroamphetamine (ADDERALL) 20 MG tablet Take one half tablet once or twice daily. 07/21/16  Yes Katrina Jeffery, PA-C  amphetamine-dextroamphetamine (ADDERALL) 20 MG tablet Take one half tablet once or twice daily. 07/21/16  Yes Katrina Jeffery, PA-C  fluticasone (FLONASE) 50 MCG/ACT nasal spray Place 2 sprays into both nostrils daily as needed for rhinitis or allergies. 08/14/15  Yes Posey Boyer, MD  ipratropium (ATROVENT) 0.03 % nasal spray Place 2 sprays into the nose 2 (two) times daily as needed for rhinitis. 06/29/14  Yes Bennett Scrape V, PA-C  lisinopril-hydrochlorothiazide (PRINZIDE,ZESTORETIC) 10-12.5 MG tablet Take 1 tablet by mouth daily. 03/06/16  Yes Darlyne Russian, MD  Magnesium 250 MG TABS Take by mouth.   Yes Historical Provider, MD  rosuvastatin (CRESTOR) 10 MG tablet TAKE 1 TABLET (10 MG TOTAL) BY MOUTH DAILY. 03/09/16  Yes Darlyne Russian, MD  chlorpheniramine-HYDROcodone (TUSSIONEX PENNKINETIC ER) 10-8 MG/5ML SUER Take 5 mLs by mouth every 12 (twelve) hours as needed for cough. Patient not taking: Reported on 07/21/2016 08/14/15   Posey Boyer, MD     Allergies  Allergen Reactions  . Codeine Nausea And Vomiting  . Penicillins Swelling  . Sulfa Antibiotics Nausea Only       Objective:  Physical Exam HEENT: PERRLA. Pulm: Good respiratory effort. CTAB. No wheezes, rales, or rhonchi. CV: RRR. No M/R/G. Abd: Soft, non-tender, non-distended. + BS x 4 quadrants.     Assessment & Plan:   1. Encounter to establish care Last fasting labs  were drawn Sept 2017. Pt has no new concerns at this time. Pt advised to return in 6 weeks for annual well exam.  2. Essential hypertension, benign Well controlled at this time. Continue Lisinopril-HCTZ as directed.  3. Attention deficit hyperactivity disorder (ADHD), unspecified ADHD type Well controlled on current medication regimen. Follow up as needed. -  amphetamine-dextroamphetamine (ADDERALL) 20 MG tablet; Take one half tablet once or twice daily.  Dispense: 30 tablet; Refill: 0 - amphetamine-dextroamphetamine (ADDERALL) 20 MG tablet; Take one half tablet once or twice daily.  Dispense: 30 tablet; Refill: 0 - amphetamine-dextroamphetamine (ADDERALL) 20 MG tablet; Take one half tablet once or twice daily.  Dispense: 30 tablet; Refill: 0  4. Anxiety Well controled on current medication regimen. Follow up as needed. - ALPRAZolam (XANAX) 0.5 MG tablet; TAKE 1/2 TO 1 TABLET BY MOUTH 2 TIMES A DAY AS NEEDED FOR ANXIETY.  Dispense: 45 tablet; Refill: 0 - ALPRAZolam (XANAX) 0.5 MG tablet; TAKE 1/2 TO 1 TABLET BY MOUTH 2 TIMES A DAY AS NEEDED FOR ANXIETY.  Dispense: 45 tablet; Refill: 0 - ALPRAZolam (XANAX) 0.5 MG tablet; TAKE 1/2 TO 1 TABLET BY MOUTH 2 TIMES A DAY AS NEEDED FOR ANXIETY.  Dispense: 45 tablet; Refill: 0  Lorella Nimrod, PA-S

## 2016-07-21 NOTE — Progress Notes (Signed)
Patient ID: Katrina Valdez, female     DOB: 1955/08/25, 61 y.o.    MRN: [id number]  PCP: Harrison Mons, PA-C  Chief Complaint  Patient presents with  . Medication Refill    xanax, adderall  . Re-establish Care    Subjective:   This patient is re-establishing with me since Dr. Perfecto Kingdom retirement and presents for evaluation of her chronic issues, primarily ADD and Anxiety, both of which have been well controlled on her current regimen for some time.  Denies adverse effects from her meds. Remains very active, caring for her grandchildren. Her son has married, and she is delighted.   Review of Systems No chest pain, SOB, HA, dizziness, vision change, N/V, diarrhea, constipation, dysuria, urinary urgency or frequency, myalgias, arthralgias or rash.   Prior to Admission medications   Medication Sig Start Date End Date Taking? Authorizing Provider  ALPRAZolam (XANAX) 0.5 MG tablet TAKE 1/2 TO 1 TABLET BY MOUTH 2 TIMES A DAY AS NEEDED FOR ANXIETY. 03/06/16  Yes Darlyne Russian, MD  amphetamine-dextroamphetamine (ADDERALL) 20 MG tablet Take one half tablet once or twice daily. 07/12/16  Yes Suede Greenawalt, PA-C  fluticasone (FLONASE) 50 MCG/ACT nasal spray Place 2 sprays into both nostrils daily as needed for rhinitis or allergies. 08/14/15  Yes Posey Boyer, MD  ipratropium (ATROVENT) 0.03 % nasal spray Place 2 sprays into the nose 2 (two) times daily as needed for rhinitis. 06/29/14  Yes Bennett Scrape V, PA-C  lisinopril-hydrochlorothiazide (PRINZIDE,ZESTORETIC) 10-12.5 MG tablet Take 1 tablet by mouth daily. 03/06/16  Yes Darlyne Russian, MD  Magnesium 250 MG TABS Take by mouth.   Yes Historical Provider, MD  rosuvastatin (CRESTOR) 10 MG tablet TAKE 1 TABLET (10 MG TOTAL) BY MOUTH DAILY. 03/09/16  Yes Darlyne Russian, MD  chlorpheniramine-HYDROcodone (TUSSIONEX PENNKINETIC ER) 10-8 MG/5ML SUER Take 5 mLs by mouth every 12 (twelve) hours as needed for cough. Patient not taking: Reported on  07/21/2016 08/14/15   Posey Boyer, MD     Allergies  Allergen Reactions  . Codeine Nausea And Vomiting  . Penicillins Swelling  . Sulfa Antibiotics Nausea Only     Patient Active Problem List   Diagnosis Date Noted  . Allergic rhinitis 06/29/2014  . Multiple rib fractures 12/19/2013  . Dysphagia, unspecified(787.20) 11/12/2013  . Fracture, sternum closed 04/05/2013  . Mixed hyperlipidemia 11/22/2012  . HTN (hypertension) 04/29/2012  . Essential hypertension, benign 04/28/2012  . Need for prophylactic vaccination and inoculation against influenza 04/28/2012  . ADHD (attention deficit hyperactivity disorder) 04/28/2012  . Anxiety 04/28/2012     Family History  Problem Relation Age of Onset  . Stroke Mother 37    hemorrhagic CVA x 2  . Hypertension Mother   . Heart disease Mother 73    AMI  . Hyperlipidemia Son   . Cancer Paternal Aunt   . Heart disease Father   . Heart disease Daughter     monitoring cardiac  . Colon cancer Neg Hx      Social History   Social History  . Marital status: Married    Spouse name: N/A  . Number of children: N/A  . Years of education: N/A   Occupational History  . retired    Social History Main Topics  . Smoking status: Former Smoker    Quit date: 11/22/1992  . Smokeless tobacco: Never Used  . Alcohol use 8.4 oz/week    14 Glasses of wine per week  Comment: daily 2 drinks  . Drug use: No  . Sexual activity: Yes    Birth control/ protection: None     Comment: number of sex partners in the last 89 months 1   Other Topics Concern  . Not on file   Social History Narrative   Marital: married x 33 years; happily married.      Children:  2 children; 2 grandchildren.      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      Exercise:  [place] 4 days per week         Objective:  Physical Exam  Constitutional: She is oriented to  person, place, and time. She appears well-developed and well-nourished. She is active and cooperative. No distress.  BP 118/82   Pulse 82   Temp 97.9 F (36.6 C) (Oral)   Resp 16   Ht 5' 2.5" (1.588 m)   Wt 147 lb 12.8 oz (67 kg)   SpO2 98%   BMI 26.60 kg/m   HENT:  Head: Normocephalic and atraumatic.  Right Ear: Hearing normal.  Left Ear: Hearing normal.  Eyes: Conjunctivae are normal. No scleral icterus.  Neck: Normal range of motion. Neck supple. No thyromegaly present.  Cardiovascular: Normal rate, regular rhythm and normal heart sounds.   Pulses:      Radial pulses are 2+ on the right side, and 2+ on the left side.  Pulmonary/Chest: Effort normal and breath sounds normal.  Lymphadenopathy:       Head (right side): No tonsillar, no preauricular, no posterior auricular and no occipital adenopathy present.       Head (left side): No tonsillar, no preauricular, no posterior auricular and no occipital adenopathy present.    She has no cervical adenopathy.       Right: No supraclavicular adenopathy present.       Left: No supraclavicular adenopathy present.  Neurological: She is alert and oriented to person, place, and time. No sensory deficit.  Skin: Skin is warm, dry and intact. No rash noted. No cyanosis or erythema. Nails show no clubbing.  Psychiatric: She has a normal mood and affect. Her speech is normal and behavior is normal.       Assessment & Plan:  1. Encounter to establish care  2. Essential hypertension, benign COntrolled.  3. Attention deficit hyperactivity disorder (ADHD), unspecified ADHD type Controlled. Continue current regimen. - amphetamine-dextroamphetamine (ADDERALL) 20 MG tablet; Take one half tablet once or twice daily.  Dispense: 30 tablet; Refill: 0 - amphetamine-dextroamphetamine (ADDERALL) 20 MG tablet; Take one half tablet once or twice daily.  Dispense: 30 tablet; Refill: 0 - amphetamine-dextroamphetamine (ADDERALL) 20 MG tablet; Take one half  tablet once or twice daily.  Dispense: 30 tablet; Refill: 0  4. Anxiety Controlled. Continue current regimen.  - ALPRAZolam (XANAX) 0.5 MG tablet; TAKE 1/2 TO 1 TABLET BY MOUTH 2 TIMES A DAY AS NEEDED FOR ANXIETY.  Dispense: 45 tablet; Refill: 0 - ALPRAZolam (XANAX) 0.5 MG tablet; TAKE 1/2 TO 1 TABLET BY MOUTH 2 TIMES A DAY AS NEEDED FOR ANXIETY.  Dispense: 45 tablet; Refill: 0 - ALPRAZolam (XANAX) 0.5 MG tablet; TAKE 1/2 TO 1 TABLET BY MOUTH 2 TIMES A DAY AS NEEDED FOR ANXIETY.  Dispense: 45 tablet; Refill: 0   Return in about 6 months (around 01/18/2017) for wellness visit, fasting labs and pap test.   Antuane Eastridge  Janalee Dane, PA-C Physician Assistant-Certified [facility]

## 2016-07-21 NOTE — Patient Instructions (Signed)
     IF you received an x-ray today, you will receive an invoice from [facility]. Please contact [facility] at [phone] with questions or concerns regarding your invoice.   IF you received labwork today, you will receive an invoice from [facility]. Please contact [facility] at [phone] with questions or concerns regarding your invoice.   Our billing staff will not be able to assist you with questions regarding bills from these companies.  You will be contacted with the lab results as soon as they are available. The fastest way to get your results is to activate your My Chart account. Instructions are located on the last page of this paperwork. If you have not heard from us regarding the results in 2 weeks, please contact this office.     

## 2016-07-24 ENCOUNTER — Encounter: Payer: Self-pay | Admitting: Physician Assistant

## 2016-07-31 NOTE — Telephone Encounter (Signed)
Santiago Glad can you close this encounter so it will exit my box, when I called IT they told me you would have to close it.

## 2016-08-22 NOTE — Telephone Encounter (Signed)
Never picked up rx  Destroyed by me

## 2016-11-04 ENCOUNTER — Encounter: Payer: Self-pay | Admitting: Physician Assistant

## 2016-11-04 ENCOUNTER — Ambulatory Visit (INDEPENDENT_AMBULATORY_CARE_PROVIDER_SITE_OTHER): Payer: BC Managed Care – PPO

## 2016-11-04 ENCOUNTER — Ambulatory Visit (INDEPENDENT_AMBULATORY_CARE_PROVIDER_SITE_OTHER): Payer: BC Managed Care – PPO | Admitting: Physician Assistant

## 2016-11-04 VITALS — BP 127/87 | HR 101 | Temp 97.6°F | Resp 18 | Ht 62.0 in | Wt 146.5 lb

## 2016-11-04 DIAGNOSIS — M19041 Primary osteoarthritis, right hand: Secondary | ICD-10-CM

## 2016-11-04 DIAGNOSIS — R202 Paresthesia of skin: Secondary | ICD-10-CM

## 2016-11-04 DIAGNOSIS — M79641 Pain in right hand: Secondary | ICD-10-CM

## 2016-11-04 MED ORDER — ACETAMINOPHEN ER 650 MG PO TBCR: 1300.0000 mg | EXTENDED_RELEASE_TABLET | Freq: Three times a day (TID) | ORAL | 11 refills | Status: AC | PRN

## 2016-11-04 MED ORDER — MELOXICAM 15 MG PO TABS: 7.5000 mg | ORAL_TABLET | Freq: Every day | ORAL | 0 refills | Status: DC | PRN

## 2016-11-04 NOTE — Patient Instructions (Addendum)
  TYLENOL 1000 MG EVERY 8 HOURS AS NEEDED FOR PAIN.   TAKE MELOXICAM  FOR BREAK THROUGH PAIN 1/2 TO 1 TABLET.     RETURN IN 2 WEEKS IF ARM PAIN DOES NOT GET BETTER   IF you received an x-ray today, you will receive an invoice from [facility]. Please contact [facility] at [phone] with questions or concerns regarding your invoice.   IF you received labwork today, you will receive an invoice from [facility]. Please contact [facility] at [phone] with questions or concerns regarding your invoice.   Our billing staff will not be able to assist you with questions regarding bills from these companies.  You will be contacted with the lab results as soon as they are available. The fastest way to get your results is to activate your My Chart account. Instructions are located on the last page of this paperwork. If you have not heard from [place] regarding the results in 2 weeks, please contact this office.

## 2016-11-04 NOTE — Progress Notes (Signed)
11/04/2016 3:07 PM   DOB: Oct 15, 1955 / MRN: [id number]  SUBJECTIVE:  Katrina Valdez is a 61 y.o. female presenting for right wrist pain that has been present for months.  She id have a fall about 2 weeks ago which has irritated the wrist. Describes the pain as a throb. The pain is better in the morning and worse with activity. She associates some arm numbness that has also been present about the C7 dermatome. SHe has not tried anything for pain.     She is allergic to codeine; penicillins; and sulfa antibiotics.   She  has a past medical history of ADHD (attention deficit hyperactivity disorder); Allergy; Anxiety; Fibromyalgia (1997); Hyperlipidemia; Hypertension; and Multiple rib fractures (12/19/2013).    She  reports that she quit smoking about 23 years ago. She has never used smokeless tobacco. She reports that she drinks about 8.4 oz of alcohol per week . She reports that she does not use drugs. She  reports that she currently engages in sexual activity. She reports using the following method of birth control/protection: None. The patient  has a past surgical history that includes Tubal ligation; Hand surgery (2011); tonsills remov (1973); sepsis; Bunionectomy; uterine ablation; and STERNUM (2014).  Her family history includes Cancer in her paternal aunt; Heart disease in her daughter and father; Heart disease (age of onset: 54) in her mother; Hyperlipidemia in her son; Hypertension in her mother; Stroke (age of onset: 7) in her mother.  Review of Systems  Constitutional: Negative for chills, diaphoresis and fever.  Respiratory: Negative for cough, hemoptysis, sputum production, shortness of breath and wheezing.   Cardiovascular: Negative for chest pain, orthopnea and leg swelling.  Gastrointestinal: Negative for nausea.  Skin: Negative for rash.  Neurological: Positive for tingling. Negative for dizziness, tremors, sensory change, speech change and focal weakness.    The problem  list and medications were reviewed and updated by myself where necessary and exist elsewhere in the encounter.   OBJECTIVE:  BP 127/87   Pulse (!) 101   Temp 97.6 F (36.4 C) (Oral)   Resp 18   Ht 5\' 2"  (1.575 m)   Wt 146 lb 8 oz (66.5 kg)   SpO2 99%   BMI 26.80 kg/m   Physical Exam  Constitutional: She is oriented to person, place, and time. She is active.  Non-toxic appearance.  Cardiovascular: Normal rate.   Pulmonary/Chest: Effort normal. No tachypnea.  Musculoskeletal:       Hands: Neurological: She is alert and oriented to person, place, and time. She has normal strength and normal reflexes. No cranial nerve deficit or sensory deficit. Coordination and gait normal.  Skin: Skin is warm and dry. She is not diaphoretic. No pallor.    No results found for this or any previous visit (from the past 72 hour(s)).  Dg Cervical Spine 2 Or 3 Views  Result Date: 11/04/2016 CLINICAL DATA:  Neck and arm pain status post fall. EXAM: CERVICAL SPINE - 2-3 VIEW COMPARISON:  None. FINDINGS: There is no evidence of cervical spine fracture or prevertebral soft tissue swelling. Alignment is normal. Degenerative disc disease noted at C4-5, C5-6 and C6-7. IMPRESSION: 1. No acute abnormality. 2. Degenerative disc disease. Electronically Signed   By: Kerby Moors M.D.   On: 11/04/2016 14:41   Dg Wrist 2 Views Right  Result Date: 11/04/2016 CLINICAL DATA:  Pain after trauma EXAM: RIGHT WRIST - 2 VIEW COMPARISON:  None. FINDINGS: There is no evidence of fracture or dislocation. There  is no evidence of arthropathy or other focal bone abnormality. Soft tissues are unremarkable. IMPRESSION: Negative. Electronically Signed   By: Dorise Bullion III M.D   On: 11/04/2016 14:48   Dg Hand 2 View Right  Result Date: 11/04/2016 CLINICAL DATA:  Pain after fall. EXAM: RIGHT HAND - 2 VIEW COMPARISON:  None. FINDINGS: Degenerative changes. A small osteophyte off the distal third metacarpals identified. An  adjacent lucency is favored to represent a prominent nutrient foramen. There is no definitive fracture in this region. No other evidence of fracture. No other acute abnormalities. IMPRESSION: No definitive fracture. Probable degenerative changes at the distal third metacarpal. Recommend clinical correlation in this region to exclude point tenderness. Electronically Signed   By: Dorise Bullion III M.D   On: 11/04/2016 14:46    ASSESSMENT AND PLAN:  Sarinity was seen today for wrist pain and medication refill.  Diagnoses and all orders for this visit:  Pain of right hand: OA per rads.  Tylenol and meloxicam for more symptomatic days. Removable wrist brace provided for PRN use.  -     DG Hand 2 View Right; Future -     DG Wrist 2 Views Right; Future  Paresthesia of arm: DJD.  Managed via problem one with po meds.  RTC for weakness or atrophy.  -     DG Cervical Spine 2 or 3 views; Future  Other orders -     meloxicam (MOBIC) 15 MG tablet; Take 0.5-1 tablets (7.5-15 mg total) by mouth daily as needed for pain. -     acetaminophen (TYLENOL 8 HOUR) 650 MG CR tablet; Take 2 tablets (1,300 mg total) by mouth every 8 (eight) hours as needed for pain.    The patient is advised to call or return to clinic if she does not see an improvement in symptoms, or to seek the care of the closest emergency department if she worsens with the above plan.   Philis Fendt, MHS, PA-C [facility] 11/04/2016 3:07 PM

## 2016-11-08 NOTE — Progress Notes (Signed)
10-4.  Thank you for reading my notes.

## 2016-11-09 ENCOUNTER — Other Ambulatory Visit: Payer: Self-pay | Admitting: Physician Assistant

## 2016-11-09 DIAGNOSIS — F909 Attention-deficit hyperactivity disorder, unspecified type: Secondary | ICD-10-CM

## 2016-11-09 MED ORDER — AMPHETAMINE-DEXTROAMPHETAMINE 20 MG PO TABS: ORAL_TABLET | ORAL | 0 refills | Status: DC

## 2016-11-09 NOTE — Telephone Encounter (Signed)
Pt aware scripts are ready for pick up

## 2016-11-09 NOTE — Telephone Encounter (Signed)
PATIENT WOULD LIKE CHELLE TO KNOW THAT IT IS TIME TO WRITE HER PRESCRIPTIONS FOR HER ADDERALL 20 MG. SHE SAID THE LAST TIME CHELLE WROTE HER 3 PRESCRIPTIONS SO THAT SHE COULD LEAVE 2 ON FILE WITH HER PHARMACY. SHE IS GOING OUT OF TOWN THE 1st OF July FOR 3 WEEKS. BEST PHONE [phone] (CELL) [place]

## 2016-11-09 NOTE — Telephone Encounter (Signed)
Meds ordered this encounter  Medications  . amphetamine-dextroamphetamine (ADDERALL) 20 MG tablet    Sig: Take one half tablet once or twice daily.    Dispense:  30 tablet    Refill:  0    Order Specific Question:   Supervising Provider    Answer:   SHAW, EVA N [[id number]]  . amphetamine-dextroamphetamine (ADDERALL) 20 MG tablet    Sig: Take one half tablet once or twice daily.    Dispense:  30 tablet    Refill:  0    May fill 30 days after date on prescription    Order Specific Question:   Supervising Provider    Answer:   Brigitte Pulse, EVA N [[id number]]  . amphetamine-dextroamphetamine (ADDERALL) 20 MG tablet    Sig: Take one half tablet once or twice daily.    Dispense:  30 tablet    Refill:  0    May fill 60 days after date on prescription    Order Specific Question:   Supervising Provider    Answer:   Brigitte Pulse, EVA N [[id number]]   I need to see her for a visit in [place].

## 2016-11-22 ENCOUNTER — Other Ambulatory Visit: Payer: Self-pay | Admitting: Physician Assistant

## 2016-11-22 DIAGNOSIS — R202 Paresthesia of skin: Secondary | ICD-10-CM

## 2017-03-09 ENCOUNTER — Encounter: Payer: Self-pay | Admitting: Physician Assistant

## 2017-03-09 ENCOUNTER — Ambulatory Visit (INDEPENDENT_AMBULATORY_CARE_PROVIDER_SITE_OTHER): Payer: BC Managed Care – PPO | Admitting: Physician Assistant

## 2017-03-09 VITALS — BP 134/82 | HR 115 | Temp 98.1°F | Resp 18 | Ht 62.0 in | Wt 141.8 lb

## 2017-03-09 DIAGNOSIS — F419 Anxiety disorder, unspecified: Secondary | ICD-10-CM

## 2017-03-09 DIAGNOSIS — Z0000 Encounter for general adult medical examination without abnormal findings: Secondary | ICD-10-CM

## 2017-03-09 DIAGNOSIS — F909 Attention-deficit hyperactivity disorder, unspecified type: Secondary | ICD-10-CM | POA: Diagnosis not present

## 2017-03-09 DIAGNOSIS — J309 Allergic rhinitis, unspecified: Secondary | ICD-10-CM | POA: Diagnosis not present

## 2017-03-09 DIAGNOSIS — E782 Mixed hyperlipidemia: Secondary | ICD-10-CM | POA: Diagnosis not present

## 2017-03-09 DIAGNOSIS — G5621 Lesion of ulnar nerve, right upper limb: Secondary | ICD-10-CM | POA: Diagnosis not present

## 2017-03-09 DIAGNOSIS — Z114 Encounter for screening for human immunodeficiency virus [HIV]: Secondary | ICD-10-CM

## 2017-03-09 DIAGNOSIS — I10 Essential (primary) hypertension: Secondary | ICD-10-CM | POA: Diagnosis not present

## 2017-03-09 DIAGNOSIS — Z23 Encounter for immunization: Secondary | ICD-10-CM

## 2017-03-09 MED ORDER — ALPRAZOLAM 0.5 MG PO TABS: ORAL_TABLET | ORAL | 0 refills | Status: DC

## 2017-03-09 MED ORDER — AMPHETAMINE-DEXTROAMPHETAMINE 20 MG PO TABS: ORAL_TABLET | ORAL | 0 refills | Status: DC

## 2017-03-09 MED ORDER — FLUTICASONE PROPIONATE 50 MCG/ACT NA SUSP: 2.0000 | Freq: Every day | NASAL | 12 refills | Status: AC | PRN

## 2017-03-09 MED ORDER — ALPRAZOLAM 0.5 MG PO TABS: ORAL_TABLET | ORAL | 0 refills | Status: AC

## 2017-03-09 MED ORDER — ROSUVASTATIN CALCIUM 10 MG PO TABS: ORAL_TABLET | ORAL | 3 refills | Status: AC

## 2017-03-09 MED ORDER — LISINOPRIL-HYDROCHLOROTHIAZIDE 10-12.5 MG PO TABS: 1.0000 | ORAL_TABLET | Freq: Every day | ORAL | 3 refills | Status: DC

## 2017-03-09 MED ORDER — IPRATROPIUM BROMIDE 0.03 % NA SOLN: 2.0000 | Freq: Two times a day (BID) | NASAL | 11 refills | Status: DC | PRN

## 2017-03-09 NOTE — Patient Instructions (Addendum)
We recommend that you schedule a mammogram for breast cancer screening. Typically, you do not need a referral to do this. Please contact a local imaging center to schedule your mammogram.  [facility] - [phone]  *ask for the Radiology Department [facility]) - [phone] or [phone]  [facility] - [phone] [facility] - [phone] [facility]  - [phone]  *ask for the Radiology Department [facility] - [phone]  *ask for the Radiology Department [facility] - [phone]  *ask for the Mammography Department [facility] - [phone]    IF you received an x-ray today, you will receive an invoice from [facility]. Please contact [facility] at [phone] with questions or concerns regarding your invoice.   IF you received labwork today, you will receive an invoice from [facility]. Please contact [facility] at [phone] with questions or concerns regarding your invoice.   Our billing staff will not be able to assist you with questions regarding bills from these companies.  You will be contacted with the lab results as soon as they are available. The fastest way to get your results is to activate your My Chart account. Instructions are located on the last page of this paperwork. If you have not heard from us regarding the results in 2 weeks, please contact this office.      Preventive Care 40-64 Years, Female Preventive care refers to lifestyle choices and visits with your health care provider that can promote health and wellness. What does preventive care include?  A yearly physical exam. This is also called an annual well check.  Dental exams once or twice a year.  Routine eye exams. Ask your health care provider how often you should have your eyes checked.  Personal lifestyle choices, including: ? Daily care of your teeth and  gums. ? Regular physical activity. ? Eating a healthy diet. ? Avoiding tobacco and drug use. ? Limiting alcohol use. ? Practicing safe sex. ? Taking low-dose aspirin daily starting at age 50. ? Taking vitamin and mineral supplements as recommended by your health care provider. What happens during an annual well check? The services and screenings done by your health care provider during your annual well check will depend on your age, overall health, lifestyle risk factors, and family history of disease. Counseling Your health care provider may ask you questions about your:  Alcohol use.  Tobacco use.  Drug use.  Emotional well-being.  Home and relationship well-being.  Sexual activity.  Eating habits.  Work and work environment.  Method of birth control.  Menstrual cycle.  Pregnancy history.  Screening You may have the following tests or measurements:  Height, weight, and BMI.  Blood pressure.  Lipid and cholesterol levels. These may be checked every 5 years, or more frequently if you are over 50 years old.  Skin check.  Lung cancer screening. You may have this screening every year starting at age 55 if you have a 30-pack-year history of smoking and currently smoke or have quit within the past 15 years.  Fecal occult blood test (FOBT) of the stool. You may have this test every year starting at age 50.  Flexible sigmoidoscopy or colonoscopy. You may have a sigmoidoscopy every 5 years or a colonoscopy every 10 years starting at age 50.  Hepatitis C blood test.  Hepatitis B blood test.  Sexually transmitted disease (STD) testing.  Diabetes screening. This is done by   by checking your blood sugar (glucose) after you have not eaten for a while (fasting). You may have this done every 1-3 years.  Mammogram. This may be done every 1-2 years. Talk to your health care provider about when you should start having regular mammograms. This may depend on whether you have a  family history of breast cancer.  BRCA-related cancer screening. This may be done if you have a family history of breast, ovarian, tubal, or peritoneal cancers.  Pelvic exam and Pap test. This may be done every 3 years starting at age 21. Starting at age 30, this may be done every 5 years if you have a Pap test in combination with an HPV test.  Bone density scan. This is done to screen for osteoporosis. You may have this scan if you are at high risk for osteoporosis. Discuss your test results, treatment options, and if necessary, the need for more tests with your health care provider. Vaccines  Your health care provider may recommend certain vaccines, such as:  Influenza vaccine. This is recommended every year.  Tetanus, diphtheria, and acellular pertussis (Tdap, Td) vaccine. You may need a Td booster every 10 years.  Varicella vaccine. You may need this if you have not been vaccinated.  Zoster vaccine. You may need this after age 60.  Measles, mumps, and rubella (MMR) vaccine. You may need at least one dose of MMR if you were born in 1957 or later. You may also need a second dose.  Pneumococcal 13-valent conjugate (PCV13) vaccine. You may need this if you have certain conditions and were not previously vaccinated.  Pneumococcal polysaccharide (PPSV23) vaccine. You may need one or two doses if you smoke cigarettes or if you have certain conditions.  Meningococcal vaccine. You may need this if you have certain conditions.  Hepatitis A vaccine. You may need this if you have certain conditions or if you travel or work in places where you may be exposed to hepatitis A.  Hepatitis B vaccine. You may need this if you have certain conditions or if you travel or work in places where you may be exposed to hepatitis B.  Haemophilus influenzae type b (Hib) vaccine. You may need this if you have certain conditions. Talk to your health care provider about which screenings and vaccines you need and  how often you need them. This information is not intended to replace advice given to you by your health care provider. Make sure you discuss any questions you have with your health care provider. Document Released: 06/25/2015 Document Revised: 02/16/2016 Document Reviewed: 03/30/2015 Elsevier Interactive Patient Education  2017 [employer]  

## 2017-03-09 NOTE — Progress Notes (Signed)
Patient ID: Katrina Valdez, female    DOB: [birth year], 61 y.o.   MRN: [id number]  PCP: Harrison Mons, PA-C  Chief Complaint  Patient presents with  . Annual Exam    No PAP needed.  . Medication Refill    Xanax 0.5 MG, Adderall 20 MG, Crestor 10 MG, Lisinopril-Hydro 10-12.5 MG    Subjective:   Presents for [employer].  Volunteering at her grandchild's school. Enjoys it a lot.  [place] recently, caught herself on outstretched RIGHT hand. Since then, has been experiencing tingling, electrical type pain in the RIGHT arm, especially when she bumps or leans on the elbow. This has not interfered with her activities, but resuming wrist splint (she had this for carpal tunnel previously) has not helped.  Cervical Cancer Screening: with GYN  Breast Cancer Screening: with GYN Colorectal Cancer Screening: 2016, repeat 2021 Bone Density Testing: not yet HIV Screening: not yet STI Screening: very low risk Seasonal Influenza Vaccination: wants to wait to get this year's dose. Td/Tdap Vaccination: 06/2015 Pneumococcal Vaccination: not yet a candidate Zoster Vaccination: not yet Frequency of Dental evaluation: Q6 months Frequency of Eye evaluation: annually   Patient Active Problem List   Diagnosis Date Noted  . Allergic rhinitis 06/29/2014  . Dysphagia, unspecified(787.20) 11/12/2013  . Mixed hyperlipidemia 11/22/2012  . Essential hypertension, benign 04/28/2012  . ADHD (attention deficit hyperactivity disorder) 04/28/2012  . Anxiety 04/28/2012    Past Medical History:  Diagnosis Date  . ADHD (attention deficit hyperactivity disorder)   . Allergy   . Anxiety   . Fibromyalgia 1997  . Fracture, sternum closed 04/05/2013  . Hyperlipidemia   . Hypertension   . Multiple rib fractures 12/19/2013     Prior to Admission medications   Medication Sig Start Date End Date Taking? Authorizing Provider  acetaminophen (TYLENOL 8 HOUR) 650 MG CR tablet Take 2 tablets (1,300  mg total) by mouth every 8 (eight) hours as needed for pain. 11/04/16  Yes Tereasa Coop, PA-C  ALPRAZolam Duanne Moron) 0.5 MG tablet TAKE 1/2 TO 1 TABLET BY MOUTH 2 TIMES A DAY AS NEEDED FOR ANXIETY. 07/21/16  Yes Nathanie Ottley, PA-C  ALPRAZolam (XANAX) 0.5 MG tablet TAKE 1/2 TO 1 TABLET BY MOUTH 2 TIMES A DAY AS NEEDED FOR ANXIETY. 07/21/16  Yes Donnie Panik, PA-C  ALPRAZolam (XANAX) 0.5 MG tablet TAKE 1/2 TO 1 TABLET BY MOUTH 2 TIMES A DAY AS NEEDED FOR ANXIETY. 07/21/16  Yes Tomica Arseneault, PA-C  amphetamine-dextroamphetamine (ADDERALL) 20 MG tablet Take one half tablet once or twice daily. 11/09/16  Yes Kerrick Miler, PA-C  amphetamine-dextroamphetamine (ADDERALL) 20 MG tablet Take one half tablet once or twice daily. 11/09/16  Yes Saory Carriero, PA-C  amphetamine-dextroamphetamine (ADDERALL) 20 MG tablet Take one half tablet once or twice daily. 11/09/16  Yes Deaire Mcwhirter, PA-C  fluticasone (FLONASE) 50 MCG/ACT nasal spray Place 2 sprays into both nostrils daily as needed for rhinitis or allergies. 08/14/15  Yes Posey Boyer, MD  ipratropium (ATROVENT) 0.03 % nasal spray Place 2 sprays into the nose 2 (two) times daily as needed for rhinitis. 06/29/14  Yes Ezekiel Slocumb, PA-C  lisinopril-hydrochlorothiazide (PRINZIDE,ZESTORETIC) 10-12.5 MG tablet Take 1 tablet by mouth daily. 03/06/16  Yes Darlyne Russian, MD  Magnesium 250 MG TABS Take by mouth.   Yes [provider]  rosuvastatin (CRESTOR) 10 MG tablet TAKE 1 TABLET (10 MG TOTAL) BY MOUTH DAILY. 03/09/16  Yes Darlyne Russian, MD  meloxicam ([place]) 15  MG tablet TAKE 1/2 TO 1 TABLET (7.5 MG TO 15 MG) BY MOUTH DAILY AS NEEDED FOR PAIN. Patient not taking: Reported on 03/09/2017 11/23/16   Tereasa Coop, PA-C    Allergies  Allergen Reactions  . Codeine Nausea And Vomiting  . Penicillins Swelling  . Sulfa Antibiotics Nausea Only    Past Surgical History:  Procedure Laterality Date  . BUNIONECTOMY    . HAND SURGERY  2011  .  sepsis    . STERNUM  2014   fracture  . tonsills remov  1973  . TUBAL LIGATION    . uterine ablation      Family History  Problem Relation Age of Onset  . Stroke Mother 38       hemorrhagic CVA x 2  . Hypertension Mother   . Heart disease Mother 73       AMI  . Hyperlipidemia Son   . Cancer Paternal Aunt   . Heart disease Father   . Heart disease Daughter        monitoring cardiac  . Colon cancer Neg Hx     Social History   Social History  . Marital status: Married    Spouse name: N/A  . Number of children: 2  . Years of education: N/A   Occupational History  . retired    Social History Main Topics  . Smoking status: Former Smoker    Quit date: 11/22/1992  . Smokeless tobacco: Never Used  . Alcohol use 8.4 oz/week    14 Glasses of wine per week     Comment: daily 2 drinks  . Drug use: No  . Sexual activity: Yes    Birth control/ protection: None     Comment: number of sex partners in the last 2 months 1   Other Topics Concern  . None   Social History Narrative   Marital: married since 1980; happily married.      Children:  2 children; 2 grandchildren.      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      Exercise:  Exercises 5-6 hours per week (pilates, core strengthening, Zumba Reserved, treadmill)       Review of Systems  Constitutional: Negative.   HENT: Positive for congestion and sinus pressure. Negative for dental problem, drooling, ear discharge, ear pain, facial swelling, hearing loss, mouth sores, nosebleeds, postnasal drip, rhinorrhea, sinus pain, sneezing, sore throat, tinnitus, trouble swallowing and voice change.   Eyes: Negative.   Respiratory: Negative.   Cardiovascular: Negative.   Gastrointestinal: Negative.   Endocrine: Negative.   Genitourinary: Negative.   Musculoskeletal: Positive for arthralgias (wrist). Negative for back pain,  gait problem, joint swelling, myalgias, neck pain and neck stiffness.  Skin: Negative.   Allergic/Immunologic: Positive for environmental allergies. Negative for food allergies and immunocompromised state.  Neurological: Negative.   Hematological: Negative.   Psychiatric/Behavioral: Positive for decreased concentration. Negative for agitation, behavioral problems, confusion, dysphoric mood, hallucinations, self-injury, sleep disturbance and suicidal ideas. The patient is not nervous/anxious and is not hyperactive.         Objective:  Physical Exam  Constitutional: She is oriented to person, place, and time. Vital signs are normal. She appears well-developed and well-nourished. She is active and cooperative. No distress.  BP 134/82 (BP Location: Left Arm, Patient Position: Sitting, Cuff Size: Normal)   Pulse (!) 115  Temp 98.1 F (36.7 C) (Oral)   Resp 18   Ht 5\' 2"  (1.575 m)   Wt 141 lb 12.8 oz (64.3 kg)   SpO2 98%   BMI 25.94 kg/m    HENT:  Head: Normocephalic and atraumatic.  Right Ear: Hearing, tympanic membrane, external ear and ear canal normal. No foreign bodies.  Left Ear: Hearing, tympanic membrane, external ear and ear canal normal. No foreign bodies.  Nose: Nose normal.  Mouth/Throat: Uvula is midline, oropharynx is clear and moist and mucous membranes are normal. No oral lesions. Normal dentition. No dental abscesses or uvula swelling. No oropharyngeal exudate.  Eyes: Pupils are equal, round, and reactive to light. Conjunctivae, EOM and lids are normal. Right eye exhibits no discharge. Left eye exhibits no discharge. No scleral icterus.  Fundoscopic exam:      The right eye shows no arteriolar narrowing, no AV nicking, no exudate, no hemorrhage and no papilledema. The right eye shows red reflex.       The left eye shows no arteriolar narrowing, no AV nicking, no exudate, no hemorrhage and no papilledema. The left eye shows red reflex.  Neck: Trachea normal, normal range  of motion and full passive range of motion without pain. Neck supple. No spinous process tenderness and no muscular tenderness present. No thyroid mass and no thyromegaly present.  Cardiovascular: Normal rate, regular rhythm, normal heart sounds, intact distal pulses and normal pulses.   Heart rate was elevated during triage, but normal on auscultation  Pulmonary/Chest: Effort normal and breath sounds normal.  Musculoskeletal: She exhibits no edema or tenderness.       Cervical back: Normal.       Thoracic back: Normal.       Lumbar back: Normal.  Lymphadenopathy:       Head (right side): No tonsillar, no preauricular, no posterior auricular and no occipital adenopathy present.       Head (left side): No tonsillar, no preauricular, no posterior auricular and no occipital adenopathy present.    She has no cervical adenopathy.       Right: No supraclavicular adenopathy present.       Left: No supraclavicular adenopathy present.  Neurological: She is alert and oriented to person, place, and time. She has normal strength and normal reflexes. No cranial nerve deficit. She exhibits normal muscle tone. Coordination and gait normal.  Skin: Skin is warm, dry and intact. No rash noted. She is not diaphoretic. No cyanosis or erythema. Nails show no clubbing.  Psychiatric: She has a normal mood and affect. Her speech is normal and behavior is normal. Judgment and thought content normal.           Assessment & Plan:   Problem List Items Addressed This Visit    Essential hypertension, benign    COntrolled.  No changes.      Relevant Medications   rosuvastatin (CRESTOR) 10 MG tablet   lisinopril-hydrochlorothiazide (PRINZIDE,ZESTORETIC) 10-12.5 MG tablet   Other Relevant Orders   CBC with Differential/Platelet   Comprehensive metabolic panel   TSH   ADHD (attention deficit hyperactivity disorder)    COntrolled on current treatment. No changes.      Relevant Medications    amphetamine-dextroamphetamine (ADDERALL) 20 MG tablet   amphetamine-dextroamphetamine (ADDERALL) 20 MG tablet   amphetamine-dextroamphetamine (ADDERALL) 20 MG tablet   Anxiety    COntrolled. Continue current treatment.      Relevant Medications   ALPRAZolam (XANAX) 0.5 MG tablet   ALPRAZolam (XANAX) 0.5 MG  tablet   ALPRAZolam (XANAX) 0.5 MG tablet   Mixed hyperlipidemia    Await labs. Adjust regimen as indicated by results.       Relevant Medications   rosuvastatin (CRESTOR) 10 MG tablet   lisinopril-hydrochlorothiazide (PRINZIDE,ZESTORETIC) 10-12.5 MG tablet   Other Relevant Orders   Comprehensive metabolic panel   Lipid panel   Allergic rhinitis    Continue PRN treatment.      Relevant Medications   ipratropium (ATROVENT) 0.03 % nasal spray   fluticasone (FLONASE) 50 MCG/ACT nasal spray    Other Visit Diagnoses    Annual physical exam    -  Primary   Age appropriate health guidance   Need for influenza vaccination       Relevant Orders   Flu Vaccine QUAD 36+ mos IM (Completed)   Care order/instruction: (Completed)   Screening for HIV (human immunodeficiency virus)       Relevant Orders   HIV antibody   Ulnar neuropathy at elbow, right       Supportive care, her preference. Let me know if she'd like additional evaluation.   Relevant Medications   amphetamine-dextroamphetamine (ADDERALL) 20 MG tablet   amphetamine-dextroamphetamine (ADDERALL) 20 MG tablet   amphetamine-dextroamphetamine (ADDERALL) 20 MG tablet   ALPRAZolam (XANAX) 0.5 MG tablet   ALPRAZolam (XANAX) 0.5 MG tablet   ALPRAZolam (XANAX) 0.5 MG tablet       Return in about 6 months (around 09/06/2017) for re-evaluation of attnetion, mood, blood pressure.   Fara Chute, PA-C [facility]

## 2017-03-11 NOTE — Assessment & Plan Note (Signed)
COntrolled on current treatment. No changes.

## 2017-03-11 NOTE — Assessment & Plan Note (Signed)
Continue PRN treatment.

## 2017-03-11 NOTE — Assessment & Plan Note (Signed)
Await labs. Adjust regimen as indicated by results.  

## 2017-03-11 NOTE — Assessment & Plan Note (Signed)
COntrolled.  No changes.

## 2017-03-11 NOTE — Assessment & Plan Note (Signed)
COntrolled. Continue current treatment.

## 2017-03-15 ENCOUNTER — Ambulatory Visit: Payer: BC Managed Care – PPO | Admitting: Physician Assistant

## 2017-03-15 DIAGNOSIS — E782 Mixed hyperlipidemia: Secondary | ICD-10-CM

## 2017-03-15 DIAGNOSIS — Z114 Encounter for screening for human immunodeficiency virus [HIV]: Secondary | ICD-10-CM

## 2017-03-15 DIAGNOSIS — I10 Essential (primary) hypertension: Secondary | ICD-10-CM

## 2017-03-15 NOTE — Progress Notes (Signed)
I did not personally examine this patient.

## 2017-03-16 ENCOUNTER — Encounter: Payer: Self-pay | Admitting: Physician Assistant

## 2017-03-16 LAB — CBC WITH DIFFERENTIAL/PLATELET
BASOS: 1 %
Basophils Absolute: 0.1 10*3/uL (ref 0.0–0.2)
EOS (ABSOLUTE): 0.1 10*3/uL (ref 0.0–0.4)
Eos: 2 %
HEMOGLOBIN: 14.9 g/dL (ref 11.1–15.9)
Hematocrit: 45.3 % (ref 34.0–46.6)
IMMATURE GRANS (ABS): 0 10*3/uL (ref 0.0–0.1)
Immature Granulocytes: 0 %
LYMPHS ABS: 2.3 10*3/uL (ref 0.7–3.1)
LYMPHS: 33 %
MCH: 29.3 pg (ref 26.6–33.0)
MCHC: 32.9 g/dL (ref 31.5–35.7)
MCV: 89 fL (ref 79–97)
Monocytes Absolute: 0.5 10*3/uL (ref 0.1–0.9)
Monocytes: 7 %
NEUTROS ABS: 4.1 10*3/uL (ref 1.4–7.0)
Neutrophils: 57 %
PLATELETS: 300 10*3/uL (ref 150–379)
RBC: 5.09 x10E6/uL (ref 3.77–5.28)
RDW: 13.3 % (ref 12.3–15.4)
WBC: 7.1 10*3/uL (ref 3.4–10.8)

## 2017-03-16 LAB — COMPREHENSIVE METABOLIC PANEL
A/G RATIO: 2.5 — AB (ref 1.2–2.2)
ALBUMIN: 4.9 g/dL — AB (ref 3.6–4.8)
ALK PHOS: 86 IU/L (ref 39–117)
ALT: 23 IU/L (ref 0–32)
AST: 30 IU/L (ref 0–40)
BILIRUBIN TOTAL: 1.3 mg/dL — AB (ref 0.0–1.2)
BUN / CREAT RATIO: 19 (ref 12–28)
BUN: 15 mg/dL (ref 8–27)
CHLORIDE: 104 mmol/L (ref 96–106)
CO2: 23 mmol/L (ref 20–29)
Calcium: 10.1 mg/dL (ref 8.7–10.3)
Creatinine, Ser: 0.78 mg/dL (ref 0.57–1.00)
GFR calc non Af Amer: 81 mL/min/{1.73_m2} (ref >59)
GFR, EST AFRICAN AMERICAN: 94 mL/min/{1.73_m2} (ref >59)
Globulin, Total: 2 g/dL (ref 1.5–4.5)
Glucose: 105 mg/dL — ABNORMAL HIGH (ref 65–99)
POTASSIUM: 4.9 mmol/L (ref 3.5–5.2)
Sodium: 142 mmol/L (ref 134–144)
TOTAL PROTEIN: 6.9 g/dL (ref 6.0–8.5)

## 2017-03-16 LAB — LIPID PANEL
CHOLESTEROL TOTAL: 162 mg/dL (ref 100–199)
Chol/HDL Ratio: 2.4 ratio (ref 0.0–4.4)
HDL: 67 mg/dL (ref >39)
LDL Calculated: 73 mg/dL (ref 0–99)
Triglycerides: 108 mg/dL (ref 0–149)
VLDL Cholesterol Cal: 22 mg/dL (ref 5–40)

## 2017-03-16 LAB — HIV ANTIBODY (ROUTINE TESTING W REFLEX): HIV SCREEN 4TH GENERATION: NONREACTIVE

## 2017-03-16 LAB — TSH: TSH: 0.715 u[IU]/mL (ref 0.450–4.500)

## 2017-06-22 ENCOUNTER — Telehealth: Payer: Self-pay

## 2017-06-22 DIAGNOSIS — F909 Attention-deficit hyperactivity disorder, unspecified type: Secondary | ICD-10-CM

## 2017-06-22 NOTE — Telephone Encounter (Signed)
Copied from [place] [id number]. Topic: Quick Communication - Rx Refill/Question >> Jun 22, 2017  9:44 AM Marin Olp L wrote: Medication: amphetamine-dextroamphetamine (ADDERALL) 20 MG tablet  Has the patient contacted their pharmacy? No. (Agent: If no, request that the patient contact the pharmacy for the refill.) Preferred Pharmacy (with phone number or street name): pick up in office Agent: Please be advised that RX refills may take up to 3 business days. We ask that you follow-up with your pharmacy.

## 2017-06-22 NOTE — Addendum Note (Signed)
Addended by: Fara Chute on: 06/22/2017 06:31 PM   Modules accepted: Orders

## 2017-06-25 MED ORDER — AMPHETAMINE-DEXTROAMPHETAMINE 20 MG PO TABS: ORAL_TABLET | ORAL | 0 refills | Status: AC

## 2017-06-25 MED ORDER — AMPHETAMINE-DEXTROAMPHETAMINE 20 MG PO TABS
ORAL_TABLET | ORAL | 0 refills | Status: DC
Start: 2017-06-25 — End: 2017-10-17

## 2017-06-25 MED ORDER — AMPHETAMINE-DEXTROAMPHETAMINE 20 MG PO TABS: ORAL_TABLET | ORAL | 0 refills | Status: DC

## 2017-06-25 NOTE — Addendum Note (Signed)
Addended by: Fara Chute on: 06/25/2017 09:28 AM   Modules accepted: Orders

## 2017-06-25 NOTE — Telephone Encounter (Signed)
Patient notified via My Chart. Rx sent electronically.  Meds ordered this encounter  Medications  . amphetamine-dextroamphetamine (ADDERALL) 20 MG tablet    Sig: Take one half tablet once or twice daily.    Dispense:  30 tablet    Refill:  0    Order Specific Question:   Supervising Provider    Answer:   SHAW, EVA N [[id number]]  . amphetamine-dextroamphetamine (ADDERALL) 20 MG tablet    Sig: Take one half tablet once or twice daily.    Dispense:  30 tablet    Refill:  0    May fill 60 days after date on prescription    Order Specific Question:   Supervising Provider    Answer:   Brigitte Pulse, EVA N [[id number]]  . amphetamine-dextroamphetamine (ADDERALL) 20 MG tablet    Sig: Take one half tablet once or twice daily.    Dispense:  30 tablet    Refill:  0    May fill 30 days after date on prescription    Order Specific Question:   Supervising Provider    Answer:   Brigitte Pulse, EVA N [[id number]]

## 2017-09-17 ENCOUNTER — Encounter: Payer: Self-pay | Admitting: Physician Assistant

## 2017-10-17 ENCOUNTER — Other Ambulatory Visit: Payer: Self-pay | Admitting: Physician Assistant

## 2017-10-17 DIAGNOSIS — F909 Attention-deficit hyperactivity disorder, unspecified type: Secondary | ICD-10-CM

## 2017-10-18 NOTE — Telephone Encounter (Signed)
Patient is requesting a refill of the following medications: Requested Prescriptions   Pending Prescriptions Disp Refills  . amphetamine-dextroamphetamine (ADDERALL) 20 MG tablet 30 tablet 0    Sig: Take one half tablet once or twice daily.    Date of patient request: 10/27/17 Last office visit: 03/09/17 Date of last refill: 06/15/17 Last refill amount: #30 0RF Follow up time period per chart: around 09/06/17 no appt scheduled at this time

## 2017-10-20 MED ORDER — AMPHETAMINE-DEXTROAMPHETAMINE 20 MG PO TABS
ORAL_TABLET | ORAL | 0 refills | Status: DC
Start: 2017-10-20 — End: 2017-11-26

## 2017-10-20 NOTE — Telephone Encounter (Signed)
Meds ordered this encounter  Medications  . amphetamine-dextroamphetamine (ADDERALL) 20 MG tablet    Sig: Take one half tablet once or twice daily.    Dispense:  30 tablet    Refill:  0   Please advise this patient that I have authorized the refill, but need to see her to authorize any more.

## 2017-11-24 ENCOUNTER — Other Ambulatory Visit: Payer: Self-pay | Admitting: Physician Assistant

## 2017-11-24 DIAGNOSIS — F909 Attention-deficit hyperactivity disorder, unspecified type: Secondary | ICD-10-CM

## 2017-11-26 ENCOUNTER — Encounter: Payer: Self-pay | Admitting: Physician Assistant

## 2017-11-26 ENCOUNTER — Other Ambulatory Visit: Payer: Self-pay | Admitting: Physician Assistant

## 2017-11-26 DIAGNOSIS — F909 Attention-deficit hyperactivity disorder, unspecified type: Secondary | ICD-10-CM

## 2017-11-26 NOTE — Telephone Encounter (Signed)
Refill request Adderall  LOV 03-09-2018  With Daphane Shepherd  Last Filled 10-20-2017  One half tablet  Once or twice daily [facility] on file

## 2017-11-27 MED ORDER — AMPHETAMINE-DEXTROAMPHETAMINE 20 MG PO TABS: ORAL_TABLET | ORAL | 0 refills | Status: DC

## 2017-11-27 NOTE — Telephone Encounter (Signed)
Done

## 2017-11-27 NOTE — Telephone Encounter (Signed)
Patient is requesting a refill of the following medications: Requested Prescriptions   Pending Prescriptions Disp Refills  . amphetamine-dextroamphetamine (ADDERALL) 20 MG tablet 30 tablet 0    Sig: Take one half tablet once or twice daily.    Date of patient request: 11/26/17 Last office visit: 03/25/17 Date of last refill: 10/20/17 Last refill amount: #30 0RF  Follow up time period per chart: none scheduled at this time

## 2017-11-29 ENCOUNTER — Emergency Department (HOSPITAL_COMMUNITY)
Admission: EM | Admit: 2017-11-29 | Discharge: 2017-11-30 | Disposition: A | Discharge status: Home / Self Care | Payer: BC Managed Care – PPO | Attending: Emergency Medicine | Admitting: Emergency Medicine

## 2017-11-29 ENCOUNTER — Other Ambulatory Visit: Payer: Self-pay

## 2017-11-29 DIAGNOSIS — Z87891 Personal history of nicotine dependence: Secondary | ICD-10-CM | POA: Insufficient documentation

## 2017-11-29 DIAGNOSIS — T464X5A Adverse effect of angiotensin-converting-enzyme inhibitors, initial encounter: Secondary | ICD-10-CM

## 2017-11-29 DIAGNOSIS — Z79899 Other long term (current) drug therapy: Secondary | ICD-10-CM | POA: Insufficient documentation

## 2017-11-29 DIAGNOSIS — R220 Localized swelling, mass and lump, head: Secondary | ICD-10-CM | POA: Diagnosis present

## 2017-11-29 DIAGNOSIS — T783XXA Angioneurotic edema, initial encounter: Secondary | ICD-10-CM | POA: Diagnosis not present

## 2017-11-29 MED ORDER — METHYLPREDNISOLONE SODIUM SUCC 125 MG IJ SOLR
125.0000 mg | Freq: Once | INTRAMUSCULAR | Status: AC
  Administered 2017-11-29: 125 mg via INTRAVENOUS
  Filled 2017-11-29: qty 2

## 2017-11-29 MED ORDER — EPINEPHRINE 0.3 MG/0.3ML IJ SOAJ
0.3000 mg | Freq: Once | INTRAMUSCULAR | Status: DC
  Filled 2017-11-29: qty 0.3

## 2017-11-29 MED ORDER — SODIUM CHLORIDE 0.9 % IV BOLUS
1000.0000 mL | Freq: Once | INTRAVENOUS | Status: AC
  Administered 2017-11-29: 1000 mL via INTRAVENOUS

## 2017-11-29 MED ORDER — DIPHENHYDRAMINE HCL 50 MG/ML IJ SOLN
50.0000 mg | Freq: Once | INTRAMUSCULAR | Status: AC
  Administered 2017-11-29: 50 mg via INTRAVENOUS
  Filled 2017-11-29: qty 1

## 2017-11-29 MED ORDER — FAMOTIDINE IN NACL 20-0.9 MG/50ML-% IV SOLN
20.0000 mg | INTRAVENOUS | Status: AC
  Administered 2017-11-29: 20 mg via INTRAVENOUS
  Filled 2017-11-29: qty 50

## 2017-11-29 NOTE — ED Triage Notes (Signed)
Patient noticed that her tongue was swollen at 21:00 tonight. Takes Lisinopril but took dose in the a.m. Also c/o swelling and pain to right eye.

## 2017-11-29 NOTE — ED Provider Notes (Signed)
[facility] Provider Note   CSN: [id number] Arrival date & time: 11/29/17  2308     History   Chief Complaint Chief Complaint  Patient presents with  . Oral Swelling    HPI Katrina Valdez is a 62 y.o. female.  62 year old female with a history of hypertension, fibromyalgia, dyslipidemia resents to the emergency department for evaluation of tongue swelling.  Symptoms began at 2100 tonight.  They have been steadily worsening.  Patient reports difficulty swallowing, but denies difficulty breathing.  She has not had any nausea or vomiting.  No medications taken prior to arrival.  She is on daily lisinopril which she takes each morning.  No new food ingestions, topical contacts.  No new soaps, lotions, detergents.     Past Medical History:  Diagnosis Date  . ADHD (attention deficit hyperactivity disorder)   . Allergy   . Anxiety   . Fibromyalgia 1997  . Fracture, sternum closed 04/05/2013  . Hyperlipidemia   . Hypertension   . Multiple rib fractures 12/19/2013    Patient Active Problem List   Diagnosis Date Noted  . Allergic rhinitis 06/29/2014  . Dysphagia, unspecified(787.20) 11/12/2013  . Mixed hyperlipidemia 11/22/2012  . Essential hypertension, benign 04/28/2012  . ADHD (attention deficit hyperactivity disorder) 04/28/2012  . Anxiety 04/28/2012    Past Surgical History:  Procedure Laterality Date  . BUNIONECTOMY    . HAND SURGERY  2011  . sepsis    . STERNUM  2014   fracture  . tonsills remov  1973  . TUBAL LIGATION    . uterine ablation       OB History   None      Home Medications    Prior to Admission medications   Medication Sig Start Date End Date Taking? Authorizing Provider  acetaminophen (TYLENOL 8 HOUR) 650 MG CR tablet Take 2 tablets (1,300 mg total) by mouth every 8 (eight) hours as needed for pain. 11/04/16   Tereasa Coop, PA-C  ALPRAZolam (XANAX) 0.5 MG tablet TAKE 1/2 TO 1 TABLET BY MOUTH  2 TIMES A DAY AS NEEDED FOR ANXIETY. 03/09/17   Jeffery, Chelle, PA-C  ALPRAZolam (XANAX) 0.5 MG tablet TAKE 1/2 TO 1 TABLET BY MOUTH 2 TIMES A DAY AS NEEDED FOR ANXIETY. 03/09/17   Jeffery, Chelle, PA-C  ALPRAZolam (XANAX) 0.5 MG tablet TAKE 1/2 TO 1 TABLET BY MOUTH 2 TIMES A DAY AS NEEDED FOR ANXIETY. 03/09/17   Harrison Mons, PA-C  amphetamine-dextroamphetamine (ADDERALL) 20 MG tablet Take one half tablet once or twice daily. 06/25/17   Harrison Mons, PA-C  amphetamine-dextroamphetamine (ADDERALL) 20 MG tablet Take one half tablet once or twice daily. 06/25/17   Harrison Mons, PA-C  amphetamine-dextroamphetamine (ADDERALL) 20 MG tablet Take one half tablet once or twice daily. 11/27/17   Weber, Damaris Hippo, PA-C  fluticasone (FLONASE) 50 MCG/ACT nasal spray Place 2 sprays into both nostrils daily as needed for rhinitis or allergies. 03/09/17   Harrison Mons, PA-C  ipratropium (ATROVENT) 0.03 % nasal spray Place 2 sprays into the nose 2 (two) times daily as needed for rhinitis. 03/09/17   Harrison Mons, PA-C  Magnesium 250 MG TABS Take by mouth.    [provider]  meloxicam (MOBIC) 15 MG tablet TAKE 1/2 TO 1 TABLET (7.5 MG TO 15 MG) BY MOUTH DAILY AS NEEDED FOR PAIN. Patient not taking: Reported on 03/09/2017 11/23/16   Tereasa Coop, PA-C  rosuvastatin (CRESTOR) 10 MG tablet TAKE 1 TABLET (10  MG TOTAL) BY MOUTH DAILY. 03/09/17   Harrison Mons, PA-C    Family History Family History  Problem Relation Age of Onset  . Stroke Mother 85       hemorrhagic CVA x 2  . Hypertension Mother   . Heart disease Mother 19       AMI  . Hyperlipidemia Son   . Cancer Paternal Aunt   . Heart disease Father   . Heart disease Daughter        monitoring cardiac  . Colon cancer Neg Hx     Social History Social History   Tobacco Use  . Smoking status: Former Smoker    Last attempt to quit: 11/22/1992    Years since quitting: 25.0  . Smokeless tobacco: Never Used  Substance Use Topics  .  Alcohol use: Yes    Alcohol/week: 8.4 oz    Types: 14 Glasses of wine per week    Comment: daily 2 drinks  . Drug use: No     Allergies   Codeine; Lisinopril; Penicillins; and Sulfa antibiotics   Review of Systems Review of Systems Ten systems reviewed and are negative for acute change, except as noted in the HPI.    Physical Exam Updated Vital Signs BP 128/84   Pulse 91   Temp 98.4 F (36.9 C) (Oral)   Resp (!) 27   Ht 5\' 3"  (1.6 m)   Wt 62.1 kg (137 lb)   SpO2 94%   BMI 24.27 kg/m   Physical Exam  Constitutional: She is oriented to person, place, and time. She appears well-developed and well-nourished. No distress.  Nontoxic appearing and in NAD  HENT:  Head: Normocephalic and atraumatic.  Swelling to the left side of the tongue. Currently patient is tolerating secretions.  Eyes: Conjunctivae and EOM are normal. No scleral icterus.  Neck: Normal range of motion.  Cardiovascular: Normal rate, regular rhythm and intact distal pulses.  NSR  Pulmonary/Chest: Effort normal. No stridor. No respiratory distress. She has no wheezes.  Lungs CTAB. No stridor.  Musculoskeletal: Normal range of motion.  Neurological: She is alert and oriented to person, place, and time. She exhibits normal muscle tone. Coordination normal.  GCS 15. Patient moving all extremities.  Skin: Skin is warm and dry. No rash noted. She is not diaphoretic. No erythema. No pallor.  Psychiatric: She has a normal mood and affect. Her behavior is normal.  Nursing note and vitals reviewed.    ED Treatments / Results  Labs (all labs ordered are listed, but only abnormal results are displayed) Labs Reviewed - No data to display  EKG None  Radiology No results found.  Procedures Procedures (including critical care time)  Medications Ordered in ED Medications  methylPREDNISolone sodium succinate (SOLU-MEDROL) 125 mg/2 mL injection 125 mg (125 mg Intravenous Given 11/29/17 2344)  famotidine  (PEPCID) IVPB 20 mg premix (0 mg Intravenous Stopped 11/30/17 0027)  diphenhydrAMINE (BENADRYL) injection 50 mg (50 mg Intravenous Given 11/29/17 2344)  sodium chloride 0.9 % bolus 1,000 mL (0 mLs Intravenous Stopped 11/30/17 0112)    CRITICAL CARE Performed by: Antonietta Breach   Total critical care time: 45 minutes  Critical care time was exclusive of separately billable procedures and treating other patients.  Critical care was necessary to treat or prevent imminent or life-threatening deterioration.  Critical care was time spent personally by me on the following activities: development of treatment plan with patient and/or surrogate as well as nursing, discussions with consultants, evaluation of patient's  response to treatment, examination of patient, obtaining history from patient or surrogate, ordering and performing treatments and interventions, ordering and review of laboratory studies, ordering and review of radiographic studies, pulse oximetry and re-evaluation of patient's condition.   Initial Impression / Assessment and Plan / ED Course  I have reviewed the triage vital signs and the nursing notes.  Pertinent labs & imaging results that were available during my care of the patient were reviewed by me and considered in my medical decision making (see chart for details).     8:36 AM 62 year old female presents to the emergency department for tongue swelling which began at 2100 tonight.  She is on Prinzide which she has been taking for over 10 years.  Reports difficulty swallowing, but is tolerating secretions.  No hypoxia or complaints of shortness of breath.  Will give Benadryl, Solu-Medrol, Pepcid and observe.  Angioedema presumed secondary to lisinopril use.  1:23 AM Patient reassessed.  She is sitting up in bed calmly, reading a book.  She is normotensive.  No tachycardia.  Denies any worsening difficulty breathing or swallowing, states "I feel like the medicine is  helping".  3:13 AM Patient, again, reassessed.  She is resting in the exam room bed.  She feels as though her swelling has improved.  On repeat examination, tongue does appear slightly less edematous.  Patient continues to tolerate secretions.  Vitals stable.  She has no complaints of worsening shortness of breath or difficulty swallowing.  5:20 AM Patient with further improvement to tongue swelling.  She denies any difficulty breathing or swallowing.  Vitals have been stable.  Patient is now almost 24 hours out from last dose of lisinopril.  I believe she is stable for further outpatient management at this time.  Return precautions discussed and provided. Patient discharged in stable condition with no unaddressed concerns.   Final Clinical Impressions(s) / ED Diagnoses   Final diagnoses:  Angiotensin converting enzyme inhibitor-aggravated angioedema, initial encounter    ED Discharge Orders    None       Antonietta Breach, PA-C 11/30/17 Thereasa Distance, MD 11/30/17 [phone]

## 2017-11-30 NOTE — ED Notes (Signed)
Pt reports feeling better, noted to be clearing her throat more often and having a sore throat. Pt ambulatory to restroom and back with steady gait

## 2017-11-30 NOTE — ED Notes (Signed)
Pt reports feeling better after medications, voice returned to  Normal with clear lung sounds

## 2017-11-30 NOTE — ED Notes (Signed)
After pushing solumedrol and benadryl, pt started having increased sob with more difficulty speaking. PA and MD at bedside. Suction set up at bedside.

## 2017-11-30 NOTE — Discharge Instructions (Addendum)
Discontinue lisinopril and follow-up with your primary care doctor for recheck of your blood pressure and to discuss the possibility of starting a different blood pressure medication.  Return to the ED for any new or concerning symptoms.

## 2018-02-17 ENCOUNTER — Encounter (HOSPITAL_COMMUNITY): Payer: Self-pay | Admitting: *Deleted

## 2018-02-17 ENCOUNTER — Emergency Department (HOSPITAL_COMMUNITY)
Admission: EM | Admit: 2018-02-17 | Discharge: 2018-02-17 | Disposition: A | Discharge status: Home / Self Care | Payer: BC Managed Care – PPO | Attending: Emergency Medicine | Admitting: Emergency Medicine

## 2018-02-17 ENCOUNTER — Other Ambulatory Visit: Payer: Self-pay

## 2018-02-17 DIAGNOSIS — T783XXA Angioneurotic edema, initial encounter: Secondary | ICD-10-CM | POA: Diagnosis not present

## 2018-02-17 DIAGNOSIS — Z87891 Personal history of nicotine dependence: Secondary | ICD-10-CM | POA: Insufficient documentation

## 2018-02-17 DIAGNOSIS — I10 Essential (primary) hypertension: Secondary | ICD-10-CM | POA: Diagnosis not present

## 2018-02-17 DIAGNOSIS — Z79899 Other long term (current) drug therapy: Secondary | ICD-10-CM | POA: Diagnosis not present

## 2018-02-17 DIAGNOSIS — R220 Localized swelling, mass and lump, head: Secondary | ICD-10-CM | POA: Diagnosis present

## 2018-02-17 MED ORDER — PREDNISONE 20 MG PO TABS
60.0000 mg | ORAL_TABLET | Freq: Once | ORAL | Status: AC
  Administered 2018-02-17: 60 mg via ORAL
  Filled 2018-02-17: qty 3

## 2018-02-17 MED ORDER — PREDNISONE 10 MG PO TABS: 40.0000 mg | ORAL_TABLET | Freq: Every day | ORAL | 0 refills | Status: AC

## 2018-02-17 NOTE — ED Notes (Signed)
Patient requesting to go home. States "I don't want my grandchildren to wake up and I not be there. I know it's still swollen but last time it did this it took a few days to go down".   MD made aware.

## 2018-02-17 NOTE — ED Notes (Signed)
Patient verbalizes understanding of medications and discharge instructions. No further questions at this time. VSS and patient ambulatory at discharge.   

## 2018-02-17 NOTE — ED Provider Notes (Signed)
[facility] Provider Note  CSN: [id number] Arrival date & time: 02/17/18 0303  Chief Complaint(s) Angioedema  HPI Katrina Valdez is a 62 y.o. 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  Similar to angioedema from June however less severe.  Isolated to left side of the tongue.  Started 2-1/2 hours prior to arrival.  No posterior oropharynx swelling or lip swelling.  No associated shortness of breath.  No rashes.  No chest pain.  No nausea or vomiting.  No headache.  No abdominal pain.  Patient took 50 mg of Benadryl prior to arrival.  Swelling is improving.  The history is provided by the patient.    Past Medical History Past Medical History:  Diagnosis Date  . ADHD (attention deficit hyperactivity disorder)   . Allergy   . Anxiety   . Fibromyalgia 1997  . Fracture, sternum closed 04/05/2013  . Hyperlipidemia   . Hypertension   . Multiple rib fractures 12/19/2013   Patient Active Problem List   Diagnosis Date Noted  . Allergic rhinitis 06/29/2014  . Dysphagia, unspecified(787.20) 11/12/2013  . Mixed hyperlipidemia 11/22/2012  . Essential hypertension, benign 04/28/2012  . ADHD (attention deficit hyperactivity disorder) 04/28/2012  . Anxiety 04/28/2012   Home Medication(s) Prior to Admission medications   Medication Sig Start Date End Date Taking? Authorizing Provider  acetaminophen (TYLENOL 8 HOUR) 650 MG CR tablet Take 2 tablets (1,300 mg total) by mouth every 8 (eight) hours as needed for pain. 11/04/16  Yes Tereasa Coop, PA-C  ALPRAZolam (XANAX) 0.5 MG tablet TAKE 1/2 TO 1 TABLET BY MOUTH 2 TIMES A DAY AS NEEDED FOR ANXIETY. Patient taking differently: Take 0.25-5 mg by mouth 2 (two) times daily as needed for anxiety.  03/09/17  Yes Jeffery, Chelle, PA  amphetamine-dextroamphetamine (ADDERALL) 20 MG tablet Take one half tablet once or twice  daily. Patient taking differently: Take 10 mg by mouth daily. Take one half tablet once or twice daily. 06/25/17  Yes Jeffery, Chelle, PA  fluticasone (FLONASE) 50 MCG/ACT nasal spray Place 2 sprays into both nostrils daily as needed for rhinitis or allergies. 03/09/17  Yes Harrison Mons, PA  Magnesium 100 MG TABS Take 50 mg by mouth at bedtime.    Yes [provider]  rosuvastatin (CRESTOR) 10 MG tablet TAKE 1 TABLET (10 MG TOTAL) BY MOUTH DAILY. Patient taking differently: Take 10 mg by mouth daily.  03/09/17  Yes Jeffery, Chelle, PA  ipratropium (ATROVENT) 0.03 % nasal spray Place 2 sprays into the nose 2 (two) times daily as needed for rhinitis. Patient not taking: Reported on 02/17/2018 03/09/17   Harrison Mons, PA  meloxicam (MOBIC) 15 MG tablet TAKE 1/2 TO 1 TABLET (7.5 MG TO 15 MG) BY MOUTH DAILY AS NEEDED FOR PAIN. Patient not taking: Reported on 03/09/2017 11/23/16   Tereasa Coop, PA-C  predniSONE (DELTASONE) 10 MG tablet Take 4 tablets (40 mg total) by mouth daily for 4 days. 02/17/18 02/21/18  Fatima Blank, MD  Past Surgical History Past Surgical History:  Procedure Laterality Date  . BUNIONECTOMY    . HAND SURGERY  2011  . sepsis    . STERNUM  2014   fracture  . tonsills remov  1973  . TUBAL LIGATION    . uterine ablation     Family History Family History  Problem Relation Age of Onset  . Stroke Mother 55       hemorrhagic CVA x 2  . Hypertension Mother   . Heart disease Mother 50       AMI  . Hyperlipidemia Son   . Cancer Paternal Aunt   . Heart disease Father   . Heart disease Daughter        monitoring cardiac  . Colon cancer Neg Hx     Social History Social History   Tobacco Use  . Smoking status: Former Smoker    Last attempt to quit: 11/22/1992    Years since quitting: 25.2  . Smokeless tobacco: Never Used    Substance Use Topics  . Alcohol use: Yes    Alcohol/week: 14.0 standard drinks    Types: 14 Glasses of wine per week    Comment: daily 2 drinks  . Drug use: No   Allergies Penicillins; Codeine; Lisinopril; and Sulfa antibiotics  Review of Systems Review of Systems All other systems are reviewed and are negative for acute change except as noted in the HPI  Physical Exam Vital Signs  I have reviewed the triage vital signs BP (!) 151/78   Pulse 86   Temp 98.7 F (37.1 C) (Oral)   Resp 15   Ht 5\' 3"  (1.6 m)   Wt 62.6 kg   SpO2 96%   BMI 24.45 kg/m   Physical Exam  Constitutional: She is oriented to person, place, and time. She appears well-developed and well-nourished. No distress.  HENT:  Head: Normocephalic and atraumatic.  Nose: Nose normal.  Mouth/Throat: No trismus in the jaw. No uvula swelling. No posterior oropharyngeal edema.    Eyes: Pupils are equal, round, and reactive to light. Conjunctivae and EOM are normal. Right eye exhibits no discharge. Left eye exhibits no discharge. No scleral icterus.  Neck: Normal range of motion. Neck supple.  Cardiovascular: Normal rate and regular rhythm. Exam reveals no gallop and no friction rub.  No murmur heard. Pulmonary/Chest: Effort normal and breath sounds normal. No stridor. No respiratory distress. She has no rales.  Abdominal: Soft. She exhibits no distension. There is no tenderness.  Musculoskeletal: She exhibits no edema or tenderness.  Neurological: She is alert and oriented to person, place, and time.  Skin: Skin is warm and dry. No rash noted. She is not diaphoretic. No erythema.  Psychiatric: She has a normal mood and affect.  Vitals reviewed.   ED Results and Treatments Labs (all labs ordered are listed, but only abnormal results are displayed) Labs Reviewed - No data to display  EKG  EKG  Interpretation  Date/Time:    Ventricular Rate:    PR Interval:    QRS Duration:   QT Interval:    QTC Calculation:   R Axis:     Text Interpretation:        Radiology No results found. Pertinent labs & imaging results that were available during my care of the patient were reviewed by me and considered in my medical decision making (see chart for details).  Medications Ordered in ED Medications  predniSONE (DELTASONE) tablet 60 mg (60 mg Oral Given 02/17/18 0428)                                                                                                                                    Procedures Procedures  (including critical care time)  Medical Decision Making / ED Course I have reviewed the nursing notes for this encounter and the patient's prior records (if available in EHR or on provided paperwork).    Angioedema of the left tongue.  No respiratory distress.  No evidence suggesting anaphylaxis.  Given oral prednisone.  Monitor for an additional 3 hours with continued improvement.  The patient appears reasonably screened and/or stabilized for discharge and I doubt any other medical condition or other [facility] requiring further screening, evaluation, or treatment in the ED at this time prior to discharge.  The patient is safe for discharge with strict return precautions.   Final Clinical Impression(s) / ED Diagnoses Final diagnoses:  Angioedema, initial encounter    Disposition: Discharge  Condition: Good  I have discussed the results, Dx and Tx plan with the patient who expressed understanding and agree(s) with the plan. Discharge instructions discussed at great length. The patient was given strict return precautions who verbalized understanding of the instructions. No further questions at time of discharge.    ED Discharge Orders         Ordered    predniSONE (DELTASONE) 10 MG tablet  Daily     02/17/18 0557           Follow Up: Primary care  provider  Call  For referral to allergist     This chart was dictated using voice recognition software.  Despite best efforts to proofread,  errors can occur which can change the documentation meaning.   Fatima Blank, MD 02/17/18 [phone]

## 2018-02-17 NOTE — ED Triage Notes (Signed)
The pt woke up with her tongue swelling 0100am.. She took 50 mg of benadryl and some liquid benadryll.  She had the same reactrion to lisinopril in June and has not had  The drug since  Her tongue lt sided is still swollen but has dereased in size at present

## 2018-02-19 DIAGNOSIS — T783XXA Angioneurotic edema, initial encounter: Secondary | ICD-10-CM | POA: Insufficient documentation

## 2019-07-17 ENCOUNTER — Ambulatory Visit: Payer: Medicare PPO | Attending: Internal Medicine

## 2020-07-26 ENCOUNTER — Encounter: Payer: Self-pay | Admitting: Internal Medicine

## 2021-02-08 DIAGNOSIS — M12811 Other specific arthropathies, not elsewhere classified, right shoulder: Secondary | ICD-10-CM | POA: Insufficient documentation

## 2023-05-26 DIAGNOSIS — M85852 Other specified disorders of bone density and structure, left thigh: Secondary | ICD-10-CM | POA: Insufficient documentation

## 2023-07-10 ENCOUNTER — Encounter: Payer: Self-pay | Admitting: Internal Medicine

## 2023-08-15 ENCOUNTER — Ambulatory Visit (AMBULATORY_SURGERY_CENTER): Payer: Medicare PPO

## 2023-08-15 VITALS — Ht 63.0 in | Wt 142.0 lb

## 2023-08-15 DIAGNOSIS — Z860100 Personal history of colon polyps, unspecified: Secondary | ICD-10-CM

## 2023-08-15 MED ORDER — NA SULFATE-K SULFATE-MG SULF 17.5-3.13-1.6 GM/177ML PO SOLN: 1.0000 | Freq: Once | ORAL | 0 refills | Status: AC

## 2023-08-15 NOTE — Progress Notes (Signed)

## 2023-08-23 ENCOUNTER — Encounter: Payer: Self-pay | Admitting: Internal Medicine

## 2023-08-30 ENCOUNTER — Encounter: Payer: Medicare PPO | Admitting: Internal Medicine

## 2024-03-28 ENCOUNTER — Ambulatory Visit
Admission: EM | Admit: 2024-03-28 | Discharge: 2024-03-28 | Disposition: A | Discharge status: Home / Self Care | Source: Ambulatory Visit | Attending: Family Medicine | Admitting: Family Medicine

## 2024-03-28 ENCOUNTER — Ambulatory Visit

## 2024-03-28 ENCOUNTER — Encounter: Payer: Self-pay | Admitting: Emergency Medicine

## 2024-03-28 ENCOUNTER — Ambulatory Visit: Payer: Self-pay | Admitting: Nurse Practitioner

## 2024-03-28 DIAGNOSIS — S60051A Contusion of right little finger without damage to nail, initial encounter: Secondary | ICD-10-CM

## 2024-03-28 DIAGNOSIS — S61216A Laceration without foreign body of right little finger without damage to nail, initial encounter: Secondary | ICD-10-CM | POA: Diagnosis not present

## 2024-03-28 NOTE — ED Provider Notes (Addendum)
 [facility]    CSN: [id number] Arrival date & time: 03/28/24  1145      History   Chief Complaint Chief Complaint  Patient presents with   Laceration    HPI Katrina Valdez is a 68 y.o. female presents for finger injury.  Patient reports 2 hours ago while walking her dog the leash got tangled around her right fifth finger.  She was wearing a ring and it caused the ring to cut into the skin causing bruising and swelling.  She states she did have a cut and it was bleeding but that is since controlled.  Denies numbness tingling.  She is not on blood thinning medications.  She is up-to-date on her tetanus from 2017.  No other concerns at this time   Laceration   Past Medical History:  Diagnosis Date   ADHD (attention deficit hyperactivity disorder)    Allergy    Anxiety    Fibromyalgia 1997   Fracture, sternum closed 04/05/2013   Hyperlipidemia    Hypertension    Multiple rib fractures 12/19/2013    Patient Active Problem List   Diagnosis Date Noted   Osteopenia of neck of left femur 05/26/2023   Rotator cuff arthropathy of right shoulder 02/08/2021   Angio-edema 02/19/2018   Allergic rhinitis 06/29/2014   Dysphagia 11/12/2013   Mixed hyperlipidemia 11/22/2012   Essential hypertension, benign 04/28/2012   ADHD (attention deficit hyperactivity disorder) 04/28/2012   Anxiety 04/28/2012    Past Surgical History:  Procedure Laterality Date   BUNIONECTOMY     HAND SURGERY  06/12/2009   ROTATOR CUFF REPAIR Right    sepsis     STERNUM  06/12/2012   fracture   tonsills remov  06/13/1971   TUBAL LIGATION     uterine ablation      OB History   No obstetric history on file.      Home Medications    Prior to Admission medications   Medication Sig Start Date End Date Taking? Authorizing Provider  acetaminophen  (TYLENOL  8 HOUR) 650 MG CR tablet Take 2 tablets (1,300 mg total) by mouth every 8 (eight) hours as needed for pain. 11/04/16   Gretta Ozell CROME, PA-C  ALPRAZolam  (XANAX ) 0.5 MG tablet TAKE 1/2 TO 1 TABLET BY MOUTH 2 TIMES A DAY AS NEEDED FOR ANXIETY. Patient taking differently: Take 0.25-5 mg by mouth 2 (two) times daily as needed for anxiety. 03/09/17   Juliane Che, PA  amphetamine -dextroamphetamine  (ADDERALL) 20 MG tablet Take one half tablet once or twice daily. Patient taking differently: Take 10 mg by mouth daily. Take one half tablet once or twice daily. 06/25/17   Juliane Che, PA  diphenhydrAMINE  (BENADRYL ) 25 mg capsule Take 25 mg by mouth as needed.    [provider]  fluticasone  (FLONASE ) 50 MCG/ACT nasal spray Place 2 sprays into both nostrils daily as needed for rhinitis or allergies. 03/09/17   Juliane Che, PA  ibuprofen (ADVIL) 200 MG tablet Take 200 mg by mouth every 8 (eight) hours as needed.    [provider]  ipratropium (ATROVENT ) 0.03 % nasal spray Place 2 sprays into the nose 2 (two) times daily as needed for rhinitis. Patient not taking: Reported on 02/17/2018 03/09/17   Juliane Che, PA  Magnesium 100 MG TABS Take 50 mg by mouth at bedtime.     [provider]  meloxicam  (MOBIC ) 15 MG tablet TAKE 1/2 TO 1 TABLET (7.5 MG TO 15 MG) BY MOUTH DAILY AS  NEEDED FOR PAIN. Patient not taking: No sig reported 11/23/16   Gretta Ozell CROME, PA-C  rosuvastatin  (CRESTOR ) 10 MG tablet TAKE 1 TABLET (10 MG TOTAL) BY MOUTH DAILY. Patient taking differently: Take 10 mg by mouth daily. 03/09/17   Juliane Che, PA    Family History Family History  Problem Relation Age of Onset   Stroke Mother 40       hemorrhagic CVA x 2   Hypertension Mother    Heart disease Mother 19       AMI   Heart disease Father    Cancer Paternal Aunt    Heart disease Daughter        monitoring cardiac   Hyperlipidemia Son    Colon cancer Neg Hx    Rectal cancer Neg Hx    Stomach cancer Neg Hx    Esophageal cancer Neg Hx     Social History Social History   Tobacco Use   Smoking status: Former     Current packs/day: 0.00    Types: Cigarettes    Quit date: 11/22/1992    Years since quitting: 31.3   Smokeless tobacco: Never  Vaping Use   Vaping status: Never Used  Substance Use Topics   Alcohol use: Yes    Alcohol/week: 14.0 standard drinks of alcohol    Types: 14 Glasses of wine per week    Comment: daily 2 drinks   Drug use: No     Allergies   Penicillins, Codeine, Lisinopril , and Sulfa antibiotics   Review of Systems Review of Systems  Skin:  Positive for wound.     Physical Exam Triage Vital Signs ED Triage Vitals  Encounter Vitals Group     BP 03/28/24 1207 (!) 144/93     Girls Systolic BP Percentile --      Girls Diastolic BP Percentile --      Boys Systolic BP Percentile --      Boys Diastolic BP Percentile --      Pulse Rate 03/28/24 1207 85     Resp 03/28/24 1207 16     Temp 03/28/24 1207 (!) 97.5 F (36.4 C)     Temp Source 03/28/24 1207 Oral     SpO2 03/28/24 1207 96 %     Weight 03/28/24 1211 143 lb (64.9 kg)     Height 03/28/24 1211 5' 3 (1.6 m)     Head Circumference --      Peak Flow --      Pain Score 03/28/24 1211 0     Pain Loc --      Pain Education --      Exclude from Growth Chart --    No data found.  Updated Vital Signs BP (!) 144/93 (BP Location: Right Arm)   Pulse 85   Temp (!) 97.5 F (36.4 C) (Oral)   Resp 16   Ht 5' 3 (1.6 m)   Wt 143 lb (64.9 kg)   SpO2 96%   BMI 25.33 kg/m   Visual Acuity Right Eye Distance:   Left Eye Distance:   Bilateral Distance:    Right Eye Near:   Left Eye Near:    Bilateral Near:     Physical Exam Vitals and nursing note reviewed.  Constitutional:      General: She is not in acute distress.    Appearance: Normal appearance. She is not ill-appearing.  HENT:     Head: Normocephalic and atraumatic.  Eyes:     Pupils: Pupils are  equal, round, and reactive to light.  Cardiovascular:     Rate and Rhythm: Normal rate.  Pulmonary:     Effort: Pulmonary effort is normal.   Musculoskeletal:       Hands:     Comments: There is a superficial approximated nonbleeding cut to the base of the right fifth finger with mild swelling and ecchymosis.  There is also a superficial abrasion to the middle lateral fifth finger.  Cap refill +2.  Mildly tender to palpation to proximal finger.  Full range of motion of finger without restriction.  See photo.  Skin:    General: Skin is warm and dry.  Neurological:     General: No focal deficit present.     Mental Status: She is alert and oriented to person, place, and time.  Psychiatric:        Mood and Affect: Mood normal.        Behavior: Behavior normal.         UC Treatments / Results  Labs (all labs ordered are listed, but only abnormal results are displayed) Labs Reviewed - No data to display  EKG   Radiology No results found.  Procedures Procedures (including critical care time)  Medications Ordered in UC Medications - No data to display  Initial Impression / Assessment and Plan / UC Course  I have reviewed the triage vital signs and the nursing notes.  Pertinent labs & imaging results that were available during my care of the patient were reviewed by me and considered in my medical decision making (see chart for details).     Wound was cleansed and dressed by nursing staff.  Wound is approximated and does not open with flexion or extension and is very superficial, no closure indicated.  Wet read of x-ray without obvious abnormalities, will contact for any positive results based on radiology overread.  Finger splint applied for comfort.  Discussed RICE therapy and OTC analgesics as needed.  Patient is up-to-date on tetanus.  Wound care reviewed as well as signs/symptoms of infection.  Advised PCP follow-up in 2 to 3 days for recheck.  ER precautions reviewed. Final Clinical Impressions(s) / UC Diagnoses   Final diagnoses:  Contusion of right little finger without damage to nail, initial encounter   Cut of skin of right little finger     Discharge Instructions      Keep the wound clean and dry.  You may use a finger splint for comfort as needed.  Elevate and ice as needed.  You may take Tylenol  or ibuprofen over-the-counter as needed for pain.  Please follow-up with your PCP in 2 to 3 days for recheck.  Please go to the ER for any worsening symptoms.  Hope you feel better soon!     ED Prescriptions   None    PDMP not reviewed this encounter.   Loreda Myla SAUNDERS, NP 03/28/24 1252    Loreda Myla SAUNDERS, NP 03/28/24 1257

## 2024-03-28 NOTE — Discharge Instructions (Addendum)
 Keep the wound clean and dry.  You may use a finger splint for comfort as needed.  Elevate and ice as needed.  You may take Tylenol  or ibuprofen over-the-counter as needed for pain.  Please follow-up with your PCP in 2 to 3 days for recheck.  Please go to the ER for any worsening symptoms.  Hope you feel better soon!

## 2024-03-28 NOTE — ED Triage Notes (Signed)
 Pt st's she was walking her dog and the leash got caught on her ring.  Pt has lac to right 5th finger with swelling

## 2024-04-16 ENCOUNTER — Ambulatory Visit

## 2024-04-16 VITALS — Ht 63.0 in | Wt 142.0 lb

## 2024-04-16 DIAGNOSIS — Z860100 Personal history of colon polyps, unspecified: Secondary | ICD-10-CM

## 2024-04-16 MED ORDER — NA SULFATE-K SULFATE-MG SULF 17.5-3.13-1.6 GM/177ML PO SOLN: 1.0000 | Freq: Once | ORAL | 0 refills | Status: AC

## 2024-04-16 NOTE — Progress Notes (Signed)

## 2024-04-25 ENCOUNTER — Encounter: Payer: Self-pay | Admitting: Internal Medicine

## 2024-04-30 ENCOUNTER — Encounter: Payer: Self-pay | Admitting: Internal Medicine

## 2024-04-30 ENCOUNTER — Ambulatory Visit: Admitting: Internal Medicine

## 2024-04-30 VITALS — BP 106/51 | HR 81 | Temp 97.3°F | Resp 15 | Ht 63.0 in | Wt 142.0 lb

## 2024-04-30 DIAGNOSIS — Z860100 Personal history of colon polyps, unspecified: Secondary | ICD-10-CM

## 2024-04-30 DIAGNOSIS — Z860101 Personal history of adenomatous and serrated colon polyps: Secondary | ICD-10-CM

## 2024-04-30 DIAGNOSIS — D125 Benign neoplasm of sigmoid colon: Secondary | ICD-10-CM

## 2024-04-30 DIAGNOSIS — D123 Benign neoplasm of transverse colon: Secondary | ICD-10-CM | POA: Diagnosis not present

## 2024-04-30 DIAGNOSIS — K5520 Angiodysplasia of colon without hemorrhage: Secondary | ICD-10-CM

## 2024-04-30 DIAGNOSIS — D120 Benign neoplasm of cecum: Secondary | ICD-10-CM | POA: Diagnosis not present

## 2024-04-30 DIAGNOSIS — K648 Other hemorrhoids: Secondary | ICD-10-CM

## 2024-04-30 DIAGNOSIS — K5730 Diverticulosis of large intestine without perforation or abscess without bleeding: Secondary | ICD-10-CM

## 2024-04-30 DIAGNOSIS — K644 Residual hemorrhoidal skin tags: Secondary | ICD-10-CM

## 2024-04-30 DIAGNOSIS — Z1211 Encounter for screening for malignant neoplasm of colon: Secondary | ICD-10-CM | POA: Diagnosis not present

## 2024-04-30 MED ORDER — SODIUM CHLORIDE 0.9 % IV SOLN: 500.0000 mL | INTRAVENOUS | Status: DC

## 2024-04-30 NOTE — Op Note (Signed)
 [facility] Patient Name: Katrina Valdez Procedure Date: 04/30/2024 3:32 PM MRN: [id number] Endoscopist: Gordy CHRISTELLA Starch , MD, [id number] Age: 68 Referring MD:  Date of Birth: Apr 14, 1956 Gender: Female Account #: [id number] Procedure:                Colonoscopy Indications:              High risk colon cancer surveillance: Personal                            history of non-advanced adenoma and SSP, Last                            colonoscopy: October 2016 (TA x 1, SSP x 1) Medicines:                Monitored Anesthesia Care Procedure:                Pre-Anesthesia Assessment:                           - Prior to the procedure, a History and Physical                            was performed, and patient medications and                            allergies were reviewed. The patient's tolerance of                            previous anesthesia was also reviewed. The risks                            and benefits of the procedure and the sedation                            options and risks were discussed with the patient.                            All questions were answered, and informed consent                            was obtained. Prior Anticoagulants: The patient has                            taken no anticoagulant or antiplatelet agents. ASA                            Grade Assessment: II - A patient with mild systemic                            disease. After reviewing the risks and benefits,                            the patient was deemed in satisfactory condition to  undergo the procedure.                           After obtaining informed consent, the colonoscope                            was passed under direct vision. Throughout the                            procedure, the patient's blood pressure, pulse, and                            oxygen saturations were monitored continuously. The                            [id number]-[id number]  Colonoscope [id number] was introduced                            through the anus and advanced to the cecum,                            identified by appendiceal orifice and ileocecal                            valve. The colonoscopy was performed without                            difficulty. The patient tolerated the procedure                            well. The quality of the bowel preparation was                            good. The ileocecal valve, appendiceal orifice, and                            rectum were photographed. Scope In: 3:39:30 PM Scope Out: 3:55:32 PM Scope Withdrawal Time: 0 hours 11 minutes 45 seconds  Total Procedure Duration: 0 hours 16 minutes 2 seconds  Findings:                 The digital rectal exam was normal.                           A 2 mm polyp was found in the cecum. The polyp was                            sessile. The polyp was removed with a cold snare.                            Resection and retrieval were complete.                           A single medium-sized angioectasia with typical  arborization was found in the cecum.                           A 5 mm polyp was found in the transverse colon. The                            polyp was sessile. The polyp was removed with a                            cold snare. Resection and retrieval were complete.                           A 4 mm polyp was found in the sigmoid colon. The                            polyp was sessile. The polyp was removed with a                            cold snare. Resection and retrieval were complete.                           Multiple medium-mouthed and small-mouthed                            diverticula were found in the sigmoid colon and                            descending colon.                           External and internal hemorrhoids were found during                            retroflexion. The hemorrhoids were small. Complications:             No immediate complications. Estimated Blood Loss:     Estimated blood loss: none. Impression:               - One 2 mm polyp in the cecum, removed with a cold                            snare. Resected and retrieved.                           - A single cecal angioectasia.                           - One 5 mm polyp in the transverse colon, removed                            with a cold snare. Resected and retrieved.                           - One 4 mm polyp in the sigmoid colon,  removed with                            a cold snare. Resected and retrieved.                           - Moderate diverticulosis in the sigmoid colon and                            in the descending colon.                           - Small external and internal hemorrhoids. Recommendation:           - Patient has a contact number available for                            emergencies. The signs and symptoms of potential                            delayed complications were discussed with the                            patient. Return to normal activities tomorrow.                            Written discharge instructions were provided to the                            patient.                           - Resume previous diet.                           - Continue present medications.                           - Await pathology results.                           - Repeat colonoscopy is recommended for                            surveillance. The colonoscopy date will be                            determined after pathology results from today's                            exam become available for review. Gordy CHRISTELLA Starch, MD 04/30/2024 3:59:31 PM This report has been signed electronically.

## 2024-04-30 NOTE — Progress Notes (Unsigned)
 GASTROENTEROLOGY PROCEDURE H&P NOTE   Primary Care Physician: Juliane Che, PA    Reason for Procedure:  History of colonic polyps  Plan:    Colonoscopy  Patient is appropriate for endoscopic procedure(s) in the ambulatory ([facility]) setting.  The nature of the procedure, as well as the risks, benefits, and alternatives were carefully and thoroughly reviewed with the patient. Ample time for discussion and questions allowed.  All questions were answered. The patient understood, was satisfied, and agreed with the plan to proceed.    HPI: Katrina Valdez is a 68 y.o. female who presents for surveillance colonoscopy.  Medical history as below.  Tolerated the prep.  No recent chest pain or shortness of breath.  No abdominal pain today.  Past Medical History:  Diagnosis Date   ADHD (attention deficit hyperactivity disorder)    Allergy    Anxiety    Fibromyalgia 1997   Fracture, sternum closed 04/05/2013   Hyperlipidemia    Hypertension    Multiple rib fractures 12/19/2013    Past Surgical History:  Procedure Laterality Date   BUNIONECTOMY     HAND SURGERY  06/12/2009   ROTATOR CUFF REPAIR Right    sepsis     STERNUM  06/12/2012   fracture   tonsills remov  06/13/1971   TUBAL LIGATION     uterine ablation      Prior to Admission medications   Medication Sig Start Date End Date Taking? Authorizing Provider  amphetamine -dextroamphetamine  (ADDERALL) 20 MG tablet Take one half tablet once or twice daily. 06/25/17  Yes Jeffery, Che, PA  fluticasone  (FLONASE ) 50 MCG/ACT nasal spray Place 2 sprays into both nostrils daily as needed for rhinitis or allergies. 03/09/17  Yes Juliane Che, PA  Magnesium 100 MG TABS Take 50 mg by mouth at bedtime.    Yes [provider]  rosuvastatin  (CRESTOR ) 10 MG tablet TAKE 1 TABLET (10 MG TOTAL) BY MOUTH DAILY. Patient taking differently: Take 10 mg by mouth daily. 03/09/17  Yes Jeffery, Chelle, PA  acetaminophen  (TYLENOL  8  HOUR) 650 MG CR tablet Take 2 tablets (1,300 mg total) by mouth every 8 (eight) hours as needed for pain. 11/04/16   Gretta Ozell CROME, PA-C  ALPRAZolam  (XANAX ) 0.5 MG tablet TAKE 1/2 TO 1 TABLET BY MOUTH 2 TIMES A DAY AS NEEDED FOR ANXIETY. 03/09/17   Juliane Che, PA  diphenhydrAMINE  (BENADRYL ) 25 mg capsule Take 25 mg by mouth as needed.    [provider]  EPINEPHrine  0.3 mg/0.3 mL IJ SOAJ injection Inject 0.3 mg into the muscle. 03/27/24   [provider]  ibuprofen (ADVIL) 200 MG tablet Take 200 mg by mouth every 8 (eight) hours as needed.    [provider]    Current Outpatient Medications  Medication Sig Dispense Refill   amphetamine -dextroamphetamine  (ADDERALL) 20 MG tablet Take one half tablet once or twice daily. 30 tablet 0   fluticasone  (FLONASE ) 50 MCG/ACT nasal spray Place 2 sprays into both nostrils daily as needed for rhinitis or allergies. 16 g 12   Magnesium 100 MG TABS Take 50 mg by mouth at bedtime.      rosuvastatin  (CRESTOR ) 10 MG tablet TAKE 1 TABLET (10 MG TOTAL) BY MOUTH DAILY. (Patient taking differently: Take 10 mg by mouth daily.) 90 tablet 3   acetaminophen  (TYLENOL  8 HOUR) 650 MG CR tablet Take 2 tablets (1,300 mg total) by mouth every 8 (eight) hours as needed for pain. 90 tablet 11   ALPRAZolam  (XANAX ) 0.5  MG tablet TAKE 1/2 TO 1 TABLET BY MOUTH 2 TIMES A DAY AS NEEDED FOR ANXIETY. 45 tablet 0   diphenhydrAMINE  (BENADRYL ) 25 mg capsule Take 25 mg by mouth as needed.     EPINEPHrine  0.3 mg/0.3 mL IJ SOAJ injection Inject 0.3 mg into the muscle.     ibuprofen (ADVIL) 200 MG tablet Take 200 mg by mouth every 8 (eight) hours as needed.     Current Facility-Administered Medications  Medication Dose Route Frequency Provider Last Rate Last Admin   0.9 %  sodium chloride  infusion  500 mL Intravenous Continuous Naeema Patlan, Gordy HERO, MD        Allergies as of 04/30/2024 - Review Complete 04/30/2024  Allergen Reaction Noted   Penicillins  Anaphylaxis and Swelling 09/06/2011   Lisinopril  Swelling 11/30/2017   Codeine Nausea And Vomiting 09/06/2011   Sulfa antibiotics Nausea Only 09/06/2011    Family History  Problem Relation Age of Onset   Stroke Mother 38       hemorrhagic CVA x 2   Hypertension Mother    Heart disease Mother 84       AMI   Heart disease Father    Cancer Paternal Aunt    Heart disease Daughter        monitoring cardiac   Hyperlipidemia Son    Colon cancer Neg Hx    Rectal cancer Neg Hx    Stomach cancer Neg Hx    Esophageal cancer Neg Hx    Colon polyps Neg Hx     Social History   Socioeconomic History   Marital status: Married    Spouse name: Not on file   Number of children: 2   Years of education: Not on file   Highest education level: Not on file  Occupational History   Occupation: retired  Tobacco Use   Smoking status: Former    Current packs/day: 0.00    Types: Cigarettes    Quit date: 11/22/1992    Years since quitting: 31.4   Smokeless tobacco: Never  Vaping Use   Vaping status: Never Used  Substance and Sexual Activity   Alcohol use: Yes    Alcohol/week: 14.0 standard drinks of alcohol    Types: 14 Glasses of wine per week    Comment: daily 2 drinks   Drug use: No   Sexual activity: Yes    Birth control/protection: None    Comment: number of sex partners in the last 12 months 1  Other Topics Concern   Not on file  Social History Narrative   Marital: married since 1980; happily married.      Children:  2 children; 2 grandchildren.      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      Exercise:  Exercises 5-6 hours per week (pilates, core strengthening, Zumba Reserved, treadmill)   Social Drivers of Health   Financial Resource Strain: Low Risk  (03/24/2024)   Received from [facility]   Overall Financial Resource Strain (CARDIA)    How hard is it for you to pay for  the very basics like food, housing, medical care, and heating?: Not hard at all  Food Insecurity: No Food Insecurity (03/24/2024)   Received from [facility]   Hunger Vital Sign    Within the past 12 months, you worried that your food would run out before you got the money to  buy more.: Never true    Within the past 12 months, the food you bought just didn't last and you didn't have money to get more.: Never true  Transportation Needs: No Transportation Needs (03/24/2024)   Received from [facility] - Transportation    In the past 12 months, has lack of transportation kept you from medical appointments or from getting medications?: No    In the past 12 months, has lack of transportation kept you from meetings, work, or from getting things needed for daily living?: No  Physical Activity: Sufficiently Active (03/24/2024)   Received from [facility]   Exercise Vital Sign    On average, how many days per week do you engage in moderate to strenuous exercise (like a brisk walk)?: 5 days    On average, how many minutes do you engage in exercise at this level?: 60 min  Stress: No Stress Concern Present (03/24/2024)   Received from [facility] - Occupational Stress Questionnaire    Do you feel stress - tense, restless, nervous, or anxious, or unable to sleep at night because your mind is troubled all the time - these days?: Not at all  Social Connections: Socially Integrated (03/24/2024)   Received from [facility]   Social Network    How would you rate your social network (family, work, friends)?: Good participation with social networks  Intimate Partner Violence: Not At Risk (03/24/2024)   Received from [facility]   HITS    Over the last 12 months how often did your partner physically hurt you?: Never    Over the last 12 months how often did your partner insult you or talk down to you?: Sometimes    Over the last 12 months how  often did your partner threaten you with physical harm?: Never    Over the last 12 months how often did your partner scream or curse at you?: Sometimes    Physical Exam: Vital signs in last 24 hours: @BP  (!) 158/99   Pulse 87   Temp (!) 97.3 F (36.3 C) (Temporal)   Ht 5' 3 (1.6 m)   Wt 142 lb (64.4 kg)   SpO2 98%   BMI 25.15 kg/m  GEN: NAD EYE: Sclerae anicteric ENT: MMM CV: Non-tachycardic Pulm: CTA b/l GI: Soft, NT/ND NEURO:  Alert & Oriented x 3   Gordy Starch, MD  Gastroenterology  04/30/2024 3:28 PM

## 2024-04-30 NOTE — Patient Instructions (Signed)

## 2024-04-30 NOTE — Progress Notes (Unsigned)
 Sedate, gd SR, tolerated procedure well, VSS, report to RN

## 2024-04-30 NOTE — Progress Notes (Unsigned)
 Pt's states no medical or surgical changes since previsit or office visit.

## 2024-04-30 NOTE — Progress Notes (Unsigned)
 Called to room to assist during endoscopic procedure.  Patient ID and intended procedure confirmed with present staff. Received instructions for my participation in the procedure from the performing physician.

## 2024-05-01 ENCOUNTER — Telehealth: Payer: Self-pay

## 2024-05-01 NOTE — Telephone Encounter (Signed)
  Follow up Call-     04/30/2024    2:57 PM  Call back number  Post procedure Call Back phone  # [phone]  Permission to leave phone message Yes     Patient questions:  Do you have a fever, pain , or abdominal swelling? No. Pain Score  0 *  Have you tolerated food without any problems? Yes.    Have you been able to return to your normal activities? Yes.    Do you have any questions about your discharge instructions: Diet   No. Medications  No. Follow up visit  No.  Do you have questions or concerns about your Care? No.  Actions: * If pain score is 4 or above: No action needed, pain <4.

## 2024-05-05 LAB — SURGICAL PATHOLOGY

## 2024-05-12 ENCOUNTER — Ambulatory Visit: Payer: Self-pay | Admitting: Internal Medicine
